# Patient Record
Sex: Female | Born: 1940 | Race: White | Hispanic: No | Marital: Married | Smoking: Never smoker
Health system: Southern US, Community
[De-identification: ages and names within clinical notes are randomized; demographics above are authoritative.]

## PROBLEM LIST (undated history)

## (undated) DIAGNOSIS — F419 Anxiety disorder, unspecified: Secondary | ICD-10-CM

## (undated) DIAGNOSIS — I4891 Unspecified atrial fibrillation: Secondary | ICD-10-CM

## (undated) DIAGNOSIS — F329 Major depressive disorder, single episode, unspecified: Secondary | ICD-10-CM

## (undated) DIAGNOSIS — B029 Zoster without complications: Secondary | ICD-10-CM

## (undated) DIAGNOSIS — K219 Gastro-esophageal reflux disease without esophagitis: Secondary | ICD-10-CM

## (undated) DIAGNOSIS — H269 Unspecified cataract: Secondary | ICD-10-CM

## (undated) DIAGNOSIS — I10 Essential (primary) hypertension: Secondary | ICD-10-CM

## (undated) DIAGNOSIS — K649 Unspecified hemorrhoids: Secondary | ICD-10-CM

## (undated) DIAGNOSIS — K5909 Other constipation: Secondary | ICD-10-CM

## (undated) DIAGNOSIS — S322XXA Fracture of coccyx, initial encounter for closed fracture: Secondary | ICD-10-CM

## (undated) DIAGNOSIS — F32A Depression, unspecified: Secondary | ICD-10-CM

## (undated) DIAGNOSIS — N952 Postmenopausal atrophic vaginitis: Secondary | ICD-10-CM

## (undated) DIAGNOSIS — M1990 Unspecified osteoarthritis, unspecified site: Secondary | ICD-10-CM

## (undated) DIAGNOSIS — M810 Age-related osteoporosis without current pathological fracture: Secondary | ICD-10-CM

## (undated) HISTORY — DX: Depression, unspecified: F32.A

## (undated) HISTORY — PX: TONSILLECTOMY: SHX5217

## (undated) HISTORY — DX: Anxiety disorder, unspecified: F41.9

## (undated) HISTORY — DX: Major depressive disorder, single episode, unspecified: F32.9

## (undated) HISTORY — DX: Unspecified cataract: H26.9

## (undated) HISTORY — DX: Gastro-esophageal reflux disease without esophagitis: K21.9

## (undated) HISTORY — DX: Other constipation: K59.09

## (undated) HISTORY — DX: Unspecified osteoarthritis, unspecified site: M19.90

## (undated) HISTORY — DX: Fracture of coccyx, initial encounter for closed fracture: S32.2XXA

## (undated) HISTORY — DX: Postmenopausal atrophic vaginitis: N95.2

## (undated) HISTORY — DX: Unspecified hemorrhoids: K64.9

## (undated) HISTORY — DX: Essential (primary) hypertension: I10

## (undated) HISTORY — DX: Zoster without complications: B02.9

---

## 1998-07-13 ENCOUNTER — Ambulatory Visit (HOSPITAL_COMMUNITY): Admission: RE | Admit: 1998-07-13 | Discharge: 1998-07-13 | Payer: Self-pay | Admitting: *Deleted

## 1998-10-20 ENCOUNTER — Other Ambulatory Visit: Admission: RE | Admit: 1998-10-20 | Discharge: 1998-10-20 | Payer: Self-pay | Admitting: *Deleted

## 1999-11-28 ENCOUNTER — Other Ambulatory Visit: Admission: RE | Admit: 1999-11-28 | Discharge: 1999-11-28 | Payer: Self-pay | Admitting: *Deleted

## 2000-11-26 ENCOUNTER — Other Ambulatory Visit: Admission: RE | Admit: 2000-11-26 | Discharge: 2000-11-26 | Payer: Self-pay | Admitting: *Deleted

## 2001-11-11 ENCOUNTER — Other Ambulatory Visit: Admission: RE | Admit: 2001-11-11 | Discharge: 2001-11-11 | Payer: Self-pay | Admitting: *Deleted

## 2002-11-12 ENCOUNTER — Other Ambulatory Visit: Admission: RE | Admit: 2002-11-12 | Discharge: 2002-11-12 | Payer: Self-pay | Admitting: *Deleted

## 2002-11-26 ENCOUNTER — Encounter: Admission: RE | Admit: 2002-11-26 | Discharge: 2002-11-26 | Payer: Self-pay | Admitting: *Deleted

## 2002-11-26 ENCOUNTER — Encounter: Payer: Self-pay | Admitting: *Deleted

## 2003-07-30 ENCOUNTER — Ambulatory Visit (HOSPITAL_COMMUNITY): Admission: RE | Admit: 2003-07-30 | Discharge: 2003-07-30 | Payer: Self-pay | Admitting: Internal Medicine

## 2003-07-30 ENCOUNTER — Encounter (INDEPENDENT_AMBULATORY_CARE_PROVIDER_SITE_OTHER): Payer: Self-pay | Admitting: Specialist

## 2003-12-03 ENCOUNTER — Other Ambulatory Visit: Admission: RE | Admit: 2003-12-03 | Discharge: 2003-12-03 | Payer: Self-pay | Admitting: Obstetrics and Gynecology

## 2004-12-15 ENCOUNTER — Encounter: Admission: RE | Admit: 2004-12-15 | Discharge: 2004-12-15 | Payer: Self-pay | Admitting: Internal Medicine

## 2005-02-20 ENCOUNTER — Ambulatory Visit: Payer: Self-pay | Admitting: Internal Medicine

## 2006-01-22 ENCOUNTER — Other Ambulatory Visit: Admission: RE | Admit: 2006-01-22 | Discharge: 2006-01-22 | Payer: Self-pay | Admitting: Obstetrics & Gynecology

## 2006-02-02 ENCOUNTER — Encounter: Admission: RE | Admit: 2006-02-02 | Discharge: 2006-02-02 | Payer: Self-pay | Admitting: Obstetrics & Gynecology

## 2006-03-15 ENCOUNTER — Encounter: Admission: RE | Admit: 2006-03-15 | Discharge: 2006-03-15 | Payer: Self-pay | Admitting: Obstetrics & Gynecology

## 2006-11-27 ENCOUNTER — Ambulatory Visit: Payer: Self-pay | Admitting: Internal Medicine

## 2006-12-03 ENCOUNTER — Ambulatory Visit (HOSPITAL_COMMUNITY): Admission: RE | Admit: 2006-12-03 | Discharge: 2006-12-03 | Payer: Self-pay | Admitting: Internal Medicine

## 2007-01-25 ENCOUNTER — Other Ambulatory Visit: Admission: RE | Admit: 2007-01-25 | Discharge: 2007-01-25 | Payer: Self-pay | Admitting: Obstetrics & Gynecology

## 2007-02-04 ENCOUNTER — Encounter: Admission: RE | Admit: 2007-02-04 | Discharge: 2007-02-04 | Payer: Self-pay | Admitting: Obstetrics & Gynecology

## 2007-09-19 DIAGNOSIS — K5909 Other constipation: Secondary | ICD-10-CM

## 2007-09-19 HISTORY — DX: Other constipation: K59.09

## 2008-01-10 ENCOUNTER — Ambulatory Visit: Payer: Self-pay | Admitting: Internal Medicine

## 2008-01-10 LAB — CONVERTED CEMR LAB
Basophils Absolute: 0 10*3/uL (ref 0.0–0.1)
Eosinophils Absolute: 0.1 10*3/uL (ref 0.0–0.7)
Eosinophils Relative: 1 % (ref 0.0–5.0)
HCT: 42.3 % (ref 36.0–46.0)
Hemoglobin: 14 g/dL (ref 12.0–15.0)
Lymphocytes Relative: 33.6 % (ref 12.0–46.0)
MCHC: 33.2 g/dL (ref 30.0–36.0)
Monocytes Absolute: 0.6 10*3/uL (ref 0.1–1.0)
Neutro Abs: 4.1 10*3/uL (ref 1.4–7.7)
Neutrophils Relative %: 57.7 % (ref 43.0–77.0)
WBC: 7.3 10*3/uL (ref 4.5–10.5)

## 2008-01-22 ENCOUNTER — Telehealth (INDEPENDENT_AMBULATORY_CARE_PROVIDER_SITE_OTHER): Payer: Self-pay | Admitting: *Deleted

## 2008-01-28 ENCOUNTER — Encounter: Payer: Self-pay | Admitting: Internal Medicine

## 2008-01-30 ENCOUNTER — Telehealth: Payer: Self-pay | Admitting: Internal Medicine

## 2008-02-05 ENCOUNTER — Ambulatory Visit: Payer: Self-pay | Admitting: Internal Medicine

## 2008-02-05 ENCOUNTER — Encounter: Payer: Self-pay | Admitting: Internal Medicine

## 2008-02-09 ENCOUNTER — Encounter: Payer: Self-pay | Admitting: Internal Medicine

## 2008-02-11 ENCOUNTER — Encounter: Admission: RE | Admit: 2008-02-11 | Discharge: 2008-02-11 | Payer: Self-pay | Admitting: Internal Medicine

## 2008-04-15 ENCOUNTER — Other Ambulatory Visit: Admission: RE | Admit: 2008-04-15 | Discharge: 2008-04-15 | Payer: Self-pay | Admitting: Obstetrics & Gynecology

## 2009-02-11 ENCOUNTER — Encounter: Admission: RE | Admit: 2009-02-11 | Discharge: 2009-02-11 | Payer: Self-pay | Admitting: Obstetrics & Gynecology

## 2009-11-02 ENCOUNTER — Emergency Department (HOSPITAL_COMMUNITY): Admission: EM | Admit: 2009-11-02 | Discharge: 2009-11-02 | Payer: Self-pay | Admitting: Emergency Medicine

## 2009-11-18 ENCOUNTER — Encounter: Payer: Self-pay | Admitting: Internal Medicine

## 2009-12-17 ENCOUNTER — Ambulatory Visit: Payer: Self-pay | Admitting: Internal Medicine

## 2009-12-17 DIAGNOSIS — R002 Palpitations: Secondary | ICD-10-CM

## 2009-12-17 DIAGNOSIS — I10 Essential (primary) hypertension: Secondary | ICD-10-CM

## 2009-12-17 DIAGNOSIS — R55 Syncope and collapse: Secondary | ICD-10-CM

## 2010-01-12 ENCOUNTER — Encounter: Payer: Self-pay | Admitting: Internal Medicine

## 2010-02-25 ENCOUNTER — Encounter: Admission: RE | Admit: 2010-02-25 | Discharge: 2010-02-25 | Payer: Self-pay | Admitting: Internal Medicine

## 2010-05-24 ENCOUNTER — Ambulatory Visit (HOSPITAL_COMMUNITY): Admission: RE | Admit: 2010-05-24 | Discharge: 2010-05-24 | Payer: Self-pay | Admitting: Internal Medicine

## 2010-06-03 ENCOUNTER — Ambulatory Visit: Payer: Self-pay | Admitting: Cardiovascular Disease

## 2010-06-03 ENCOUNTER — Inpatient Hospital Stay (HOSPITAL_COMMUNITY): Admission: EM | Admit: 2010-06-03 | Discharge: 2010-06-04 | Payer: Self-pay | Admitting: Emergency Medicine

## 2010-06-04 ENCOUNTER — Encounter: Payer: Self-pay | Admitting: Cardiovascular Disease

## 2010-06-08 ENCOUNTER — Telehealth (INDEPENDENT_AMBULATORY_CARE_PROVIDER_SITE_OTHER): Payer: Self-pay | Admitting: *Deleted

## 2010-06-09 ENCOUNTER — Ambulatory Visit: Payer: Self-pay | Admitting: Internal Medicine

## 2010-06-09 ENCOUNTER — Ambulatory Visit: Payer: Self-pay

## 2010-06-09 ENCOUNTER — Encounter: Payer: Self-pay | Admitting: Internal Medicine

## 2010-06-09 ENCOUNTER — Encounter (HOSPITAL_COMMUNITY): Admission: RE | Admit: 2010-06-09 | Discharge: 2010-08-19 | Payer: Self-pay | Admitting: Internal Medicine

## 2010-06-20 ENCOUNTER — Telehealth: Payer: Self-pay | Admitting: Internal Medicine

## 2010-06-21 ENCOUNTER — Ambulatory Visit: Payer: Self-pay | Admitting: Internal Medicine

## 2010-06-21 DIAGNOSIS — I4891 Unspecified atrial fibrillation: Secondary | ICD-10-CM

## 2010-06-21 DIAGNOSIS — R609 Edema, unspecified: Secondary | ICD-10-CM | POA: Insufficient documentation

## 2010-07-12 ENCOUNTER — Telehealth (INDEPENDENT_AMBULATORY_CARE_PROVIDER_SITE_OTHER): Payer: Self-pay | Admitting: *Deleted

## 2010-09-27 ENCOUNTER — Ambulatory Visit: Admit: 2010-09-27 | Payer: Self-pay | Admitting: Internal Medicine

## 2010-10-18 NOTE — Assessment & Plan Note (Signed)
Summary: eph./ appt is 1:30/ gd   Visit Type:  Follow-up Primary Provider:  Guerry Bruin   History of Present Illness: April Decker returns today for evaluation of peripheral edema after starting cardizem.  The patient has a h/o unexplained syncope and has undergone evaluation with a cardiac monitor which has been uneventful.  She was seen in the hospital with new onset atrial fib and had a stress test which was negative.  She has preserved LV function.  She has not had syncope since I last saw her in the hospital.  She denies dietary non-compliance though she admits to craving salt at times.  No other complaints today.    Current Medications (verified): 1)  Aspirin 81 Mg Tbec (Aspirin) .... Take One Tablet By Mouth Daily 2)  Diltiazem Hcl Er Beads 180 Mg Xr24h-Cap (Diltiazem Hcl Er Beads) .... Take One Capsule By Mouth Daily 3)  Protonix 40 Mg Solr (Pantoprazole Sodium) .... Once Daily 4)  Reclast 5 Mg/155ml Soln (Zoledronic Acid) .... Uad 5)  Nitrostat 0.4 Mg Subl (Nitroglycerin) .Marland Kitchen.. 1 Tablet Under Tongue At Onset of Chest Pain; You May Repeat Every 5 Minutes For Up To 3 Doses. 6)  Multivitamins   Tabs (Multiple Vitamin) .... Once Daily  Allergies: 1)  ! * X-Ray Dye 2)  ! * Shellfish  Past History:  Past Medical History: Last updated: 12/17/2009 Current Problems:  SYNCOPE (ICD-780.2)  GERD HTN  Review of Systems       All systems reviewed and negative except as noted in the HPI and fatigue.  Vital Signs:  Patient profile:   70 year old female Height:      66.5 inches Weight:      134 pounds BMI:     21.38 Pulse rate:   72 / minute BP sitting:   150 / 80  (left arm)  Vitals Entered By: Laurance Flatten CMA (June 21, 2010 3:05 PM)  Physical Exam  General:  Well developed, well nourished, in no acute distress.  HEENT: normal Neck: supple. No JVD. Carotids 2+ bilaterally no bruits Cor: RRR no rubs, gallops or murmur Lungs: CTA with no wheezes, rales, or rhonchi. Ab:  soft, nontender. nondistended. No HSM. Good bowel sounds Ext: warm. no cyanosis, clubbing or edema Neuro: alert and oriented. Grossly nonfocal. affect pleasant    Event Monitor  Procedure date:  06/21/2010  Findings:      NSR  Impression & Recommendations:  Problem # 1:  ATRIAL FIBRILLATION (ICD-427.31) Her symptoms appear to be controlled.  I will continue a low dose of beta blocker. Her updated medication list for this problem includes:    Aspirin 81 Mg Tbec (Aspirin) .Marland Kitchen... Take one tablet by mouth daily    Carvedilol 3.125 Mg Tabs (Carvedilol) ..... One by mouth two times a day  Problem # 2:  ESSENTIAL HYPERTENSION, BENIGN (ICD-401.1) Her blood pressure is elevated today.  Will start a low dose of carvedilol. Her updated medication list for this problem includes:    Aspirin 81 Mg Tbec (Aspirin) .Marland Kitchen... Take one tablet by mouth daily    Carvedilol 3.125 Mg Tabs (Carvedilol) ..... One by mouth two times a day  Problem # 3:  SYNCOPE (ICD-780.2) She has had no recent episodes. Will follow. Her updated medication list for this problem includes:    Aspirin 81 Mg Tbec (Aspirin) .Marland Kitchen... Take one tablet by mouth daily    Nitrostat 0.4 Mg Subl (Nitroglycerin) .Marland Kitchen... 1 tablet under tongue at onset of chest pain;  you may repeat every 5 minutes for up to 3 doses.    Carvedilol 3.125 Mg Tabs (Carvedilol) ..... One by mouth two times a day  Problem # 4:  EDEMA (ICD-782.3) this is a new problem and is likely related to her cardizem. She has stopped this and will maintain a low sodium diet.  Patient Instructions: 1)  Your physician recommends that you schedule a follow-up appointment in: 3 months with Dr Ladona Ridgel 2)  Your physician has recommended you make the following change in your medication: stop Diltiazem and start Carvedilol 3.125mg  two times a day Prescriptions: CARVEDILOL 3.125 MG TABS (CARVEDILOL) one by mouth two times a day  #60 x 6   Entered by:   Dennis Bast, RN, BSN   Authorized  by:   Laren Boom, MD, [facility]   Signed by:   Dennis Bast, RN, BSN on 06/21/2010   Method used:   Electronically to        [facility]* (retail)       [address]       [place], [place]  [address]       Ph: [phone]       Fax: [phone]   RxID:   [id number]

## 2010-10-18 NOTE — Letter (Signed)
Summary: [facility] Phone Note  [facility] Phone Note   Imported By: Roderic Ovens 12/27/2009 14:08:15  _____________________________________________________________________  External Attachment:    Type:   Image     Comment:   External Document

## 2010-10-18 NOTE — Assessment & Plan Note (Signed)
Summary: Cardiology Nuclear Testing  Nuclear Med Background Indications for Stress Test: Evaluation for Ischemia, Post Hospital  Indications Comments: 06/03/10 [facility]: CP/tightness/fatigue/DOE > PAF/Flutter  History: Echo, Myocardial Perfusion Study  History Comments: 06/04/10 ECHO EF 60% MPS H/O Syncope  Symptoms: Chest Pain, Chest Tightness, DOE, Palpitations    Nuclear Pre-Procedure Cardiac Risk Factors: Hypertension Caffeine/Decaff Intake: Green tea at 7:00 AM NPO After: 7:00 AM Lungs: clear IV 0.9% NS with Angio Cath: 20g     IV Site: R Hand IV Started by: Doyne Keel, CNMT Chest Size (in) 38     Cup Size B     Height (in): 66.5 Weight (lb): 135 BMI: 21.54  Nuclear Med Study 1 or 2 day study:  1 day     Stress Test Type:  Stress Reading MD:  Arvilla Meres, MD     Referring MD:  G.Taylor Resting Radionuclide:  Technetium 77m Tetrofosmin     Resting Radionuclide Dose:  11 mCi  Stress Radionuclide:  Technetium 56m Tetrofosmin     Stress Radionuclide Dose:  32.8 mCi   Stress Protocol Exercise Time (min):  7:00 min     Max HR:  150 bpm     Predicted Max HR:  149 bpm  Max Systolic BP: 215 mm Hg     Percent Max HR:  100.67 %     METS: 8.5 Rate Pressure Product:  09811    Stress Test Technologist:  Milana Na, EMT-P     Nuclear Technologist:  Doyne Keel, CNMT  Rest Procedure  Myocardial perfusion imaging was performed at rest 45 minutes following the intravenous administration of Technetium 65m Tetrofosmin.  Stress Procedure  The patient exercised for 7:00. The patient stopped due to fatigue and denied any chest pain.  There were non specific ST-T wave change, rare pacs, and pat.  Technetium 67m Tetrofosmin was injected at peak exercise and myocardial perfusion imaging was performed after a brief delay.  QPS Raw Data Images:  Normal; no motion artifact; normal heart/lung ratio. Transient Ischemic Dilatation:  1.01  (Normal <1.22)  Lung/Heart Ratio:  0.30   (Normal <0.45)  Quantitative Gated Spect Images QGS EDV:  57 ml QGS ESV:  7 ml QGS EF:  87 % QGS cine images:  Normal  Findings Normal nuclear study      Overall Impression  Exercise Capacity: Fair exercise capacity. BP Response: Hypertensive blood pressure response. Clinical Symptoms: No chest pain ECG Impression: Insignificant upsloping ST segment depression. Overall Impression: Normal stress nuclear study.

## 2010-10-18 NOTE — Procedures (Signed)
Summary: Summary Report  Summary Report   Imported By: Erle Crocker 06/23/2010 13:12:01  _____________________________________________________________________  External Attachment:    Type:   Image     Comment:   External Document

## 2010-10-18 NOTE — Assessment & Plan Note (Signed)
Summary: nep/syncope/jss   Visit Type:  nep/syncope Primary Provider:  Guerry Bruin   History of Present Illness: April Decker is referred today by Dr. Wylene Simmer for evaluation of unexplained syncope.  The patient has a h/o HTN.  She had her initial episode on Dec. 28.  She had not eaten breakfast when she was cleaning her house.  She began to feelight headed as if she would fall.  She noted that the room turned dark and she held on to the wall before sitting down.  She eventually improved.  She went to the ED and was monitored. There her initial blood pressure was high.  She had another episode just over a month ago when she was late for a doctor's visit and when she finally got to the office, she felt like she was about to pass out.  She was placed on a monitor and was taken to the ED.  She was again monitored for several hours before being discharged. No additional spells since.  She notes concommitant palpitations which she describes as a butterfly in her chest.  The last seconds to minutes and are not associated with syncope.  Problems Prior to Update: 1)  Syncope  (ICD-780.2)  Current Medications (verified): 1)  Aspirin 81 Mg Tbec (Aspirin) .... Take One Tablet By Mouth Daily  Allergies (verified): 1)  ! * X-Ray Dye 2)  ! * Shellfish  Past History:  Past Medical History: Current Problems:  SYNCOPE (ICD-780.2)  GERD HTN  Family History: Mother died of CA at 65 Father died of a stroke at 107  Social History: Married denies ETOH or tobacco abuse.  Review of Systems       All systems reviewed and negative except as noted in the HPI.  Vital Signs:  Patient profile:   70 year old female Height:      66 inches Weight:      131 pounds BMI:     21.22 Pulse rate:   77 / minute BP sitting:   121 / 65  (left arm)  Vitals Entered By: Oswald Hillock (December 17, 2009 10:14 AM)  Serial Vital Signs/Assessments:  Time      Position  BP       Pulse  Resp  Temp     By               132/76   80                    Oswald Hillock                     121/65   [address]                     121/79                         Oswald Hillock                     846/96   [address]                     126/77   80  Oswald Hillock  Comments: no symptoms By: Oswald Hillock  2 mins  no symptoms By: Oswald Hillock   3 min. no symptoms By: Oswald Hillock    Physical Exam  General:  Well developed, well nourished, in no acute distress.  HEENT: normal Neck: supple. No JVD. Carotids 2+ bilaterally no bruits Cor: RRR no rubs, gallops or murmur Lungs: CTA with no wheezes, rales, or rhonchi. Ab: soft, nontender. nondistended. No HSM. Good bowel sounds Ext: warm. no cyanosis, clubbing or edema Neuro: alert and oriented. Grossly nonfocal. affect pleasant    EKG  Procedure date:  12/17/2009  Findings:      Normal sinus rhythm with rate of:  76. IRBBB.  Impression & Recommendations:  Problem # 1:  SYNCOPE (ICD-780.2) She has actually never lost postural tone and fallen out.  Her symptoms are most consistent with neurally mediated syncope with evidence of orthostasis.  I have asked that she stop her ASA as she had a h/o falling Hgb and to not miss any meals, increase her fluid and salt intake, minimize caffiene and ETOH, and most importantly to go to a horizontal position when she has a spell. Her updated medication list for this problem includes:    Aspirin 81 Mg Tbec (Aspirin) .Marland Kitchen... Take one tablet by mouth daily  Problem # 2:  ESSENTIAL HYPERTENSION, BENIGN (ICD-401.1) Her blood pressure has been minimally elevated and she did not drop significantly with orthostatic testing today.  I have asked that she increase her salt and fluid intake as I have noted that patients with syncope will often have fewer episodes if they live with a little higher  pressure. Her updated medication list for this problem includes:    Aspirin 81 Mg Tbec (Aspirin) .Marland Kitchen... Take one tablet by mouth daily  Problem # 3:  PALPITATIONS, OCCASIONAL (ICD-785.1) This appears to be a minimal problem.  I have requested a period of watchful waiting. Her updated medication list for this problem includes:    Aspirin 81 Mg Tbec (Aspirin) .Marland Kitchen... Take one tablet by mouth daily  Patient Instructions: 1)  Your physician wants you to follow-up in: 6 months with Dr Ladona Ridgel.  You will receive a reminder letter in the mail two months in advance. If you don't receive a letter, please call our office to schedule the follow-up appointment.

## 2010-10-18 NOTE — Progress Notes (Signed)
Summary: c/o after stress test -trouble walking  Phone Note Call from Patient Call back at Home Phone [phone]   Caller: Patient Reason for Call: Talk to Nurse, Lab or Test Results Details for Reason: c/o after stress test having trouble walking. pain in left foot.  Initial call taken by: Lorne Skeens,  June 20, 2010 9:06 AM  Follow-up for Phone Call        9/22 rt leg started hurting (woke her up ) during night.  was able to fall off to sleep.  Since then has had sweeling in ankles and soreness on outsides of feet aand ankels down to bottom of feet.  Has had trouble walking . discussed with Dr Ladona Ridgel.  Prob d/c Cardizem.  He will discuss at OV today Dennis Bast, RN, BSN  June 21, 2010 3:41 PM

## 2010-10-18 NOTE — Progress Notes (Signed)
  Records recieved from [facility] gave to [facility] for [facility] 09/27/10 appt. [facility] [place]  July 12, 2010 8:42 AM

## 2010-10-18 NOTE — Progress Notes (Signed)
Summary: Nuclear Pre-Procedure  Phone Note Outgoing Call Call back at [facility] Phone [phone]   Call placed by: Stanton Kidney, EMT-P,  June 08, 2010 2:42 PM Call placed to: Patient Action Taken: Phone Call Completed Summary of Call: Reviewed information on [employer] Information Sheet (see scanned document for further details).  Spoke with the Patient.    Nuclear Med Background Indications for Stress Test: Evaluation for Ischemia, Post Hospital  Indications Comments: 06/03/10 [facility]: CP/tightness/fatigue/DOE > PAF/Flutter  History: Echo   Symptoms: Chest Pain, Chest Tightness, DOE, Palpitations    Nuclear Pre-Procedure Cardiac Risk Factors: Hypertension Height (in): 66

## 2010-10-21 NOTE — Letter (Signed)
Summary: [facility] Office Note  [facility] Office Note   Imported By: Roderic Ovens 12/27/2009 14:09:03  _____________________________________________________________________  External Attachment:    Type:   Image     Comment:   External Document

## 2010-12-01 LAB — CBC
Hemoglobin: 13.8 g/dL (ref 12.0–15.0)
MCHC: 34.3 g/dL (ref 30.0–36.0)
Platelets: 234 10*3/uL (ref 150–400)
Platelets: 240 10*3/uL (ref 150–400)
RDW: 12.9 % (ref 11.5–15.5)
RDW: 13.2 % (ref 11.5–15.5)
WBC: 7.2 10*3/uL (ref 4.0–10.5)
WBC: 8.6 10*3/uL (ref 4.0–10.5)

## 2010-12-01 LAB — CARDIAC PANEL(CRET KIN+CKTOT+MB+TROPI)
CK, MB: 1.2 ng/mL (ref 0.3–4.0)
CK, MB: 1.4 ng/mL (ref 0.3–4.0)
CK, MB: 1.5 ng/mL (ref 0.3–4.0)
Relative Index: INVALID (ref 0.0–2.5)
Troponin I: 0.02 ng/mL (ref 0.00–0.06)

## 2010-12-01 LAB — URINE CULTURE
Colony Count: NO GROWTH
Culture  Setup Time: 201109161113
Culture: NO GROWTH

## 2010-12-01 LAB — BASIC METABOLIC PANEL
BUN: 10 mg/dL (ref 6–23)
BUN: 12 mg/dL (ref 6–23)
CO2: 28 mEq/L (ref 19–32)
Chloride: 103 mEq/L (ref 96–112)
Chloride: 105 mEq/L (ref 96–112)
Creatinine, Ser: 0.77 mg/dL (ref 0.4–1.2)
GFR calc Af Amer: >60 mL/min (ref >60)
GFR calc Af Amer: >60 mL/min (ref >60)
Potassium: 4.5 mEq/L (ref 3.5–5.1)
Sodium: 139 mEq/L (ref 135–145)

## 2010-12-01 LAB — POCT CARDIAC MARKERS
CKMB, poc: 1 ng/mL (ref 1.0–8.0)
Myoglobin, poc: 49.4 ng/mL (ref 12–200)

## 2010-12-01 LAB — DIFFERENTIAL
Basophils Absolute: 0 10*3/uL (ref 0.0–0.1)
Basophils Relative: 0 % (ref 0–1)
Eosinophils Relative: 1 % (ref 0–5)
Lymphs Abs: 1.6 10*3/uL (ref 0.7–4.0)
Neutro Abs: 6.2 10*3/uL (ref 1.7–7.7)

## 2010-12-01 LAB — URINALYSIS, ROUTINE W REFLEX MICROSCOPIC
Glucose, UA: NEGATIVE mg/dL
Hgb urine dipstick: NEGATIVE
Nitrite: NEGATIVE
Protein, ur: NEGATIVE mg/dL
Urobilinogen, UA: 0.2 mg/dL (ref 0.0–1.0)
pH: 7.5 (ref 5.0–8.0)

## 2010-12-01 LAB — LIPID PANEL
Cholesterol: 156 mg/dL (ref 0–200)
LDL Cholesterol: 75 mg/dL (ref 0–99)
Triglycerides: 50 mg/dL (ref <150)

## 2010-12-01 LAB — PROTIME-INR: Prothrombin Time: 13.6 seconds (ref 11.6–15.2)

## 2010-12-01 LAB — APTT: aPTT: 32 seconds (ref 24–37)

## 2010-12-01 LAB — CK TOTAL AND CKMB (NOT AT ARMC): Total CK: 62 U/L (ref 7–177)

## 2010-12-01 LAB — TROPONIN I: Troponin I: 0.01 ng/mL (ref 0.00–0.06)

## 2010-12-01 LAB — TSH: TSH: 3.028 u[IU]/mL (ref 0.350–4.500)

## 2010-12-08 LAB — DIFFERENTIAL
Eosinophils Relative: 1 % (ref 0–5)
Lymphocytes Relative: 20 % (ref 12–46)
Lymphs Abs: 1.5 10*3/uL (ref 0.7–4.0)
Monocytes Absolute: 0.6 10*3/uL (ref 0.1–1.0)

## 2010-12-08 LAB — URINALYSIS, ROUTINE W REFLEX MICROSCOPIC
Ketones, ur: NEGATIVE mg/dL
Nitrite: NEGATIVE
Protein, ur: NEGATIVE mg/dL
Urobilinogen, UA: 0.2 mg/dL (ref 0.0–1.0)
pH: 6.5 (ref 5.0–8.0)

## 2010-12-08 LAB — GLUCOSE, CAPILLARY: Glucose-Capillary: 86 mg/dL (ref 70–99)

## 2010-12-08 LAB — POCT I-STAT, CHEM 8
BUN: 17 mg/dL (ref 6–23)
Chloride: 106 mEq/L (ref 96–112)
Creatinine, Ser: 0.7 mg/dL (ref 0.4–1.2)
Sodium: 139 mEq/L (ref 135–145)
TCO2: 27 mmol/L (ref 0–100)

## 2010-12-08 LAB — POCT CARDIAC MARKERS
CKMB, poc: 1.5 ng/mL (ref 1.0–8.0)
Myoglobin, poc: 52.2 ng/mL (ref 12–200)
Troponin i, poc: <0.05 ng/mL (ref 0.00–0.09)

## 2010-12-08 LAB — CBC
HCT: 37.1 % (ref 36.0–46.0)
Hemoglobin: 12.9 g/dL (ref 12.0–15.0)
Platelets: 217 10*3/uL (ref 150–400)
WBC: 7.8 10*3/uL (ref 4.0–10.5)

## 2011-01-31 NOTE — Assessment & Plan Note (Signed)
[facility]                         GASTROENTEROLOGY OFFICE NOTE   JAKE, GOODSON                         MRN:          [id number]  DATE:01/10/2008                            DOB:          [birth year]    REFERRING PHYSICIAN:  Gaspar Garbe, M.D.   Office Add-On Note   PROBLEM:  Anemia.   HISTORY:  April Decker is a pleasant 70 year old female with history of chronic  constipation.  She is known to Dr. Juanda Chance and was last seen about a year  ago in March of 2008 at that time with her chronic colonic dysmotility.  She last underwent colonoscopy in November of 2004 and this was a normal  exam.  She had endoscopy as well in November of 2004, which did show one  small gastric polyp, otherwise negative exam.   The patient does not have a history of anemia as far as she is aware,  had recent lab work done and was found to have a hemoglobin of 9.9.  We  have copies of those labs.  Her hematocrit was 27.7, MCV of 88,  platelets 176, WBC of 4.2, BUN and creatinine were normal, and liver  function studies also normal.  These were done on 12/31/2007.  She did  have a vitamin D level done as well, which was 44, within normal limits.  The patient in retrospect, said she feels that she did have a very dark  stool several weeks ago, but had also eaten a lot of blackberries around  that time, and really did not think anything of it at the time.  She has  not noted any other melena or hematochezia.  She does suffer from  chronic GERD and said that she has had a lot of heartburn and  indigestion over the past couple of month.  She is on Prilosec 20 mg at  h.s.  She has occasional complaints of dysphasia particularly with  pills, and feels that she gets some irritation with swallowing  especially with very dry foods.  She had been on Fosamax, which she is  now holding per Dr. Wylene Simmer.  She denies any regular aspirin or NSAID  use.  She has had a lot of intestinal  gurgling and noises, but has not  had any abdominal pain, changes in her bowel habits, etc.   CURRENT MEDICATIONS:  1. Fosamax Plus D weekly, currently on hold.  2. Prilosec 20 mg daily.  3. Glucosamine/Chondroitin t.i.d.  4. Ester-C supplement daily.  5. Omega 3 daily.  6. Multivitamin daily.   ALLERGIES/INTOLERANCES:  IVP DYE AND SHELLFISH.   FAMILY HISTORY:  Mother with some sort of cancer complicated by liver  METS.  No other family history of colon cancer of GI malignancies that  she is aware of.   EXAMINATION:  Well developed, white female in no acute distress.  Weight  is 131.8, blood pressure 124/76, pulse 78.  HEENT:  Nontraumatic, normocephalic.  EOMI,  PERRLA.  Sclerae anicteric.  CARDIOVASCULAR:  Regular rate and rhythm with S1, S2.  No murmur, rub or  gallop.  PULMONARY:  Clear to auscultation and percussion.  ABDOMEN:  Soft.  Bowel sounds are active.  She is nontender.  There is  no palpable mass.  She does have some fullness in mid abdomen.  No  hepatosplenomegaly  RECTAL EXAM:  Stool is Hemoccult negative.   IMPRESSION:  1. 70 year old female with new normocytic anemia of unclear      etiology.  Rule out intermittent occult GI blood loss.  Rule out      iron deficiency.  2. Gastroesophageal reflux disease, poorly controlled.  Rule out      esophagitis.  3. Dysphagia, intermittent.  Rule out peptic stricture.   PLAN:  1. Schedule upper endoscopy with possible esophageal dilation.  2. Schedule colonoscopy.  3. Check iron studies.  4. Followup hemoglobin/hematocrit.  5. Continue to hold the Fosamax.  6. Switch from generic Prilosec to Nexium 40 mg b.i.d.      Mike Gip, PA-C  Electronically Signed      Iva Boop, MD,FACG  Electronically Signed   AE/MedQ  DD: 01/13/2008  DT: 01/13/2008  Job #: [id number]   cc:   Gaspar Garbe, M.D.  Hedwig Morton. Juanda Chance, MD

## 2011-02-03 NOTE — Assessment & Plan Note (Signed)
HEALTHCARE                         GASTROENTEROLOGY OFFICE NOTE   NAME:Garbers, SHANITRA PHILLIPPI                         MRN:          [id number]  DATE:11/27/2006                            DOB:          [birth year]    Ms. Beitz is a very nice 70 year old white female with history of  chronic constipation and intermittent lower abdominal pain. We have  evaluated her in the past. She presented for a screening colonoscopy in  November 2004 and she at that time was quite constipated. Upper  endoscopy and colonoscopy on July 30, 2003 showed essentially normal  colon as well as normal stomach and duodenum. She has had intermittent  left lower quadrant abdominal pain more so recently. The patient has  seen Dr. Leda Quail for GYN followup  in the summer of 2007 and was  found to have a normal exam . CT scan of the abdomen and pelvis at  [facility] on Jan 22, 2006 was done without IV CONTRAST because  of patient's allergy. The exam was essentially normal except for large  amount of retained stool in the colon. There was no evidence of  structural lesion.  Dr Hyacinth Meeker referred her to Dr Loreta Ave, but pt  did not  undergo any furhte GI evaluation.The patient since then has had  intermittent left lower quadrant abdominal pain, localized anteriorly  which does not radiate to the back. There has been no rectal bleeding.   MEDICATIONS:  1. Fosamax 70 mg weekly.  2. Multiple vitamins.  3. Omega-3.  4. Aspirin 81 mg p.o. daily.  5. Vitamin D.   PHYSICAL EXAMINATION:  VITAL SIGNS:  Blood pressure 150/74, pulse 60,  weight 135 pounds.  GENERAL:  She was alert, oriented, in no distress.  LUNGS:  Clear to auscultation.  COR:  With normal S1, normal S2.  ABDOMEN:  Soft with minimal discomfort in left lower quadrant and left  middle quadrant without palpable or focal on the right middle and upper  quadrants which was palpable and most likely represented a full colon  of  stool, but cannot rule out mass. Stool was Hemoccult negative.   The patient was also evaluated by Dr. Loreta Ave. She was referred to Dr. Loreta Ave  by Dr. Hyacinth Meeker. In the office around the time of the CT scan of the  abdomen, the patient was not satisfied with the evaluation and returned  back to my care.   IMPRESSION:  A 70 year old white female with chronic colonic dysmotility  causing constipation clinically as well as radiographically. She ,in the  past ,was on mild laxatives without effective  response. She is in need  of somewhat stronger regimen. There is no evidence of occult GI blood  loss. There were no structural lesions on last colonoscopy four years  ago.   PLAN:  1. Colonic Sits mark study to assess the colonic motility.  2. Start MiraLax 17 g every day to every other day after completion of      the Sitz marks.  3. She has complete physical exam scheduled with Dr. Wylene Simmer mid April  2008.  4. Depending on the result of the Sitz marks and of the response to      MiraLax, we may need to repeat a colonoscopic exam .     Hedwig Morton. Juanda Chance, MD  Electronically Signed    DMB/MedQ  DD: 11/27/2006  DT: 11/29/2006  Job #: [id number]   cc:   Gaspar Garbe, M.D.  Lum Keas, MD

## 2011-02-14 ENCOUNTER — Other Ambulatory Visit: Payer: Self-pay | Admitting: Internal Medicine

## 2011-02-14 DIAGNOSIS — N644 Mastodynia: Secondary | ICD-10-CM

## 2011-02-14 DIAGNOSIS — N6320 Unspecified lump in the left breast, unspecified quadrant: Secondary | ICD-10-CM

## 2011-02-28 ENCOUNTER — Ambulatory Visit
Admission: RE | Admit: 2011-02-28 | Discharge: 2011-02-28 | Disposition: A | Discharge status: Home / Self Care | Payer: Medicare Other | Source: Ambulatory Visit | Attending: Internal Medicine | Admitting: Internal Medicine

## 2011-02-28 DIAGNOSIS — N6320 Unspecified lump in the left breast, unspecified quadrant: Secondary | ICD-10-CM

## 2011-02-28 DIAGNOSIS — N644 Mastodynia: Secondary | ICD-10-CM

## 2011-10-23 DIAGNOSIS — R002 Palpitations: Secondary | ICD-10-CM | POA: Diagnosis not present

## 2011-10-23 DIAGNOSIS — I4891 Unspecified atrial fibrillation: Secondary | ICD-10-CM | POA: Diagnosis not present

## 2011-10-23 DIAGNOSIS — F411 Generalized anxiety disorder: Secondary | ICD-10-CM | POA: Diagnosis not present

## 2011-10-23 DIAGNOSIS — I10 Essential (primary) hypertension: Secondary | ICD-10-CM | POA: Diagnosis not present

## 2011-10-23 DIAGNOSIS — F329 Major depressive disorder, single episode, unspecified: Secondary | ICD-10-CM | POA: Diagnosis not present

## 2012-01-09 ENCOUNTER — Other Ambulatory Visit: Payer: Self-pay | Admitting: Dermatology

## 2012-01-09 DIAGNOSIS — L259 Unspecified contact dermatitis, unspecified cause: Secondary | ICD-10-CM | POA: Diagnosis not present

## 2012-01-16 DIAGNOSIS — R350 Frequency of micturition: Secondary | ICD-10-CM | POA: Diagnosis not present

## 2012-01-16 DIAGNOSIS — R82998 Other abnormal findings in urine: Secondary | ICD-10-CM | POA: Diagnosis not present

## 2012-01-16 DIAGNOSIS — R21 Rash and other nonspecific skin eruption: Secondary | ICD-10-CM | POA: Diagnosis not present

## 2012-01-16 DIAGNOSIS — F411 Generalized anxiety disorder: Secondary | ICD-10-CM | POA: Diagnosis not present

## 2012-01-24 DIAGNOSIS — Z79899 Other long term (current) drug therapy: Secondary | ICD-10-CM | POA: Diagnosis not present

## 2012-01-24 DIAGNOSIS — M949 Disorder of cartilage, unspecified: Secondary | ICD-10-CM | POA: Diagnosis not present

## 2012-01-24 DIAGNOSIS — M899 Disorder of bone, unspecified: Secondary | ICD-10-CM | POA: Diagnosis not present

## 2012-01-30 DIAGNOSIS — N390 Urinary tract infection, site not specified: Secondary | ICD-10-CM | POA: Diagnosis not present

## 2012-01-30 DIAGNOSIS — R350 Frequency of micturition: Secondary | ICD-10-CM | POA: Diagnosis not present

## 2012-02-01 DIAGNOSIS — Z79899 Other long term (current) drug therapy: Secondary | ICD-10-CM | POA: Diagnosis not present

## 2012-02-01 DIAGNOSIS — F411 Generalized anxiety disorder: Secondary | ICD-10-CM | POA: Diagnosis not present

## 2012-02-01 DIAGNOSIS — M899 Disorder of bone, unspecified: Secondary | ICD-10-CM | POA: Diagnosis not present

## 2012-02-01 DIAGNOSIS — Z0000 Encounter for general adult medical examination without abnormal findings: Secondary | ICD-10-CM | POA: Diagnosis not present

## 2012-02-01 DIAGNOSIS — M949 Disorder of cartilage, unspecified: Secondary | ICD-10-CM | POA: Diagnosis not present

## 2012-02-01 DIAGNOSIS — R21 Rash and other nonspecific skin eruption: Secondary | ICD-10-CM | POA: Diagnosis not present

## 2012-02-01 DIAGNOSIS — Z1212 Encounter for screening for malignant neoplasm of rectum: Secondary | ICD-10-CM | POA: Diagnosis not present

## 2012-02-13 DIAGNOSIS — H2510 Age-related nuclear cataract, unspecified eye: Secondary | ICD-10-CM | POA: Diagnosis not present

## 2012-02-13 DIAGNOSIS — H04129 Dry eye syndrome of unspecified lacrimal gland: Secondary | ICD-10-CM | POA: Diagnosis not present

## 2012-02-13 DIAGNOSIS — H43819 Vitreous degeneration, unspecified eye: Secondary | ICD-10-CM | POA: Diagnosis not present

## 2012-02-28 DIAGNOSIS — R350 Frequency of micturition: Secondary | ICD-10-CM | POA: Diagnosis not present

## 2012-03-11 DIAGNOSIS — N390 Urinary tract infection, site not specified: Secondary | ICD-10-CM | POA: Diagnosis not present

## 2012-03-25 ENCOUNTER — Other Ambulatory Visit: Payer: Self-pay | Admitting: Internal Medicine

## 2012-03-25 DIAGNOSIS — Z1231 Encounter for screening mammogram for malignant neoplasm of breast: Secondary | ICD-10-CM

## 2012-04-08 ENCOUNTER — Ambulatory Visit
Admission: RE | Admit: 2012-04-08 | Discharge: 2012-04-08 | Disposition: A | Discharge status: Home / Self Care | Payer: Medicare Other | Source: Ambulatory Visit | Attending: Internal Medicine | Admitting: Internal Medicine

## 2012-04-08 DIAGNOSIS — Z1231 Encounter for screening mammogram for malignant neoplasm of breast: Secondary | ICD-10-CM | POA: Diagnosis not present

## 2012-04-17 DIAGNOSIS — M899 Disorder of bone, unspecified: Secondary | ICD-10-CM | POA: Diagnosis not present

## 2012-04-25 DIAGNOSIS — I10 Essential (primary) hypertension: Secondary | ICD-10-CM | POA: Diagnosis not present

## 2012-04-25 DIAGNOSIS — N3000 Acute cystitis without hematuria: Secondary | ICD-10-CM | POA: Diagnosis not present

## 2012-05-23 DIAGNOSIS — M546 Pain in thoracic spine: Secondary | ICD-10-CM | POA: Diagnosis not present

## 2012-05-27 DIAGNOSIS — M546 Pain in thoracic spine: Secondary | ICD-10-CM | POA: Diagnosis not present

## 2012-05-30 DIAGNOSIS — M546 Pain in thoracic spine: Secondary | ICD-10-CM | POA: Diagnosis not present

## 2012-05-31 DIAGNOSIS — M546 Pain in thoracic spine: Secondary | ICD-10-CM | POA: Diagnosis not present

## 2012-05-31 DIAGNOSIS — M899 Disorder of bone, unspecified: Secondary | ICD-10-CM | POA: Diagnosis not present

## 2012-05-31 DIAGNOSIS — M1990 Unspecified osteoarthritis, unspecified site: Secondary | ICD-10-CM | POA: Diagnosis not present

## 2012-05-31 DIAGNOSIS — M542 Cervicalgia: Secondary | ICD-10-CM | POA: Diagnosis not present

## 2012-06-12 DIAGNOSIS — M546 Pain in thoracic spine: Secondary | ICD-10-CM | POA: Diagnosis not present

## 2012-06-19 DIAGNOSIS — Z124 Encounter for screening for malignant neoplasm of cervix: Secondary | ICD-10-CM | POA: Diagnosis not present

## 2012-06-19 DIAGNOSIS — Z01419 Encounter for gynecological examination (general) (routine) without abnormal findings: Secondary | ICD-10-CM | POA: Diagnosis not present

## 2012-06-19 DIAGNOSIS — Z23 Encounter for immunization: Secondary | ICD-10-CM | POA: Diagnosis not present

## 2012-06-19 DIAGNOSIS — M546 Pain in thoracic spine: Secondary | ICD-10-CM | POA: Diagnosis not present

## 2012-06-25 DIAGNOSIS — M546 Pain in thoracic spine: Secondary | ICD-10-CM | POA: Diagnosis not present

## 2012-06-28 DIAGNOSIS — R51 Headache: Secondary | ICD-10-CM | POA: Diagnosis not present

## 2012-06-28 DIAGNOSIS — M899 Disorder of bone, unspecified: Secondary | ICD-10-CM | POA: Diagnosis not present

## 2012-06-28 DIAGNOSIS — G500 Trigeminal neuralgia: Secondary | ICD-10-CM | POA: Diagnosis not present

## 2012-07-11 ENCOUNTER — Other Ambulatory Visit: Payer: Self-pay | Admitting: Dermatology

## 2012-07-11 DIAGNOSIS — H61009 Unspecified perichondritis of external ear, unspecified ear: Secondary | ICD-10-CM | POA: Diagnosis not present

## 2012-07-11 DIAGNOSIS — L723 Sebaceous cyst: Secondary | ICD-10-CM | POA: Diagnosis not present

## 2012-07-16 ENCOUNTER — Other Ambulatory Visit (HOSPITAL_COMMUNITY): Payer: Self-pay | Admitting: *Deleted

## 2012-07-17 ENCOUNTER — Encounter (HOSPITAL_COMMUNITY)
Admission: RE | Admit: 2012-07-17 | Discharge: 2012-07-17 | Disposition: A | Discharge status: Home / Self Care | Payer: Medicare Other | Source: Ambulatory Visit | Attending: Internal Medicine | Admitting: Internal Medicine

## 2012-07-26 ENCOUNTER — Encounter (HOSPITAL_COMMUNITY)
Admission: RE | Admit: 2012-07-26 | Discharge: 2012-07-26 | Disposition: A | Discharge status: Home / Self Care | Payer: Medicare Other | Source: Ambulatory Visit | Attending: Internal Medicine | Admitting: Internal Medicine

## 2012-07-26 ENCOUNTER — Encounter (HOSPITAL_COMMUNITY): Payer: Self-pay

## 2012-07-26 DIAGNOSIS — M810 Age-related osteoporosis without current pathological fracture: Secondary | ICD-10-CM | POA: Insufficient documentation

## 2012-07-26 HISTORY — DX: Age-related osteoporosis without current pathological fracture: M81.0

## 2012-07-26 HISTORY — DX: Unspecified atrial fibrillation: I48.91

## 2012-07-26 MED ORDER — SODIUM CHLORIDE 0.9 % IV SOLN
Freq: Once | INTRAVENOUS | Status: AC
  Administered 2012-07-26: 14:00:00 via INTRAVENOUS

## 2012-07-26 MED ORDER — ZOLEDRONIC ACID 5 MG/100ML IV SOLN
5.0000 mg | Freq: Once | INTRAVENOUS | Status: AC
  Administered 2012-07-26: 5 mg via INTRAVENOUS
  Filled 2012-07-26: qty 100

## 2012-09-30 DIAGNOSIS — Z23 Encounter for immunization: Secondary | ICD-10-CM | POA: Diagnosis not present

## 2012-10-01 DIAGNOSIS — L02419 Cutaneous abscess of limb, unspecified: Secondary | ICD-10-CM | POA: Diagnosis not present

## 2012-10-01 DIAGNOSIS — B353 Tinea pedis: Secondary | ICD-10-CM | POA: Diagnosis not present

## 2012-10-01 DIAGNOSIS — L03119 Cellulitis of unspecified part of limb: Secondary | ICD-10-CM | POA: Diagnosis not present

## 2012-10-28 DIAGNOSIS — F341 Dysthymic disorder: Secondary | ICD-10-CM | POA: Diagnosis not present

## 2012-10-28 DIAGNOSIS — R002 Palpitations: Secondary | ICD-10-CM | POA: Diagnosis not present

## 2012-10-28 DIAGNOSIS — I10 Essential (primary) hypertension: Secondary | ICD-10-CM | POA: Diagnosis not present

## 2012-10-28 DIAGNOSIS — K219 Gastro-esophageal reflux disease without esophagitis: Secondary | ICD-10-CM | POA: Diagnosis not present

## 2012-10-28 DIAGNOSIS — I4891 Unspecified atrial fibrillation: Secondary | ICD-10-CM | POA: Diagnosis not present

## 2012-10-28 DIAGNOSIS — R0789 Other chest pain: Secondary | ICD-10-CM | POA: Diagnosis not present

## 2012-11-07 DIAGNOSIS — J069 Acute upper respiratory infection, unspecified: Secondary | ICD-10-CM | POA: Diagnosis not present

## 2013-01-28 DIAGNOSIS — M949 Disorder of cartilage, unspecified: Secondary | ICD-10-CM | POA: Diagnosis not present

## 2013-01-28 DIAGNOSIS — M899 Disorder of bone, unspecified: Secondary | ICD-10-CM | POA: Diagnosis not present

## 2013-01-28 DIAGNOSIS — Z79899 Other long term (current) drug therapy: Secondary | ICD-10-CM | POA: Diagnosis not present

## 2013-02-04 DIAGNOSIS — IMO0002 Reserved for concepts with insufficient information to code with codable children: Secondary | ICD-10-CM | POA: Diagnosis not present

## 2013-02-04 DIAGNOSIS — Z23 Encounter for immunization: Secondary | ICD-10-CM | POA: Diagnosis not present

## 2013-02-04 DIAGNOSIS — M1990 Unspecified osteoarthritis, unspecified site: Secondary | ICD-10-CM | POA: Diagnosis not present

## 2013-02-04 DIAGNOSIS — Z1212 Encounter for screening for malignant neoplasm of rectum: Secondary | ICD-10-CM | POA: Diagnosis not present

## 2013-02-04 DIAGNOSIS — M949 Disorder of cartilage, unspecified: Secondary | ICD-10-CM | POA: Diagnosis not present

## 2013-02-04 DIAGNOSIS — F321 Major depressive disorder, single episode, moderate: Secondary | ICD-10-CM | POA: Diagnosis not present

## 2013-02-04 DIAGNOSIS — Z0000 Encounter for general adult medical examination without abnormal findings: Secondary | ICD-10-CM | POA: Diagnosis not present

## 2013-02-04 DIAGNOSIS — M899 Disorder of bone, unspecified: Secondary | ICD-10-CM | POA: Diagnosis not present

## 2013-02-04 DIAGNOSIS — Z79899 Other long term (current) drug therapy: Secondary | ICD-10-CM | POA: Diagnosis not present

## 2013-02-04 DIAGNOSIS — M546 Pain in thoracic spine: Secondary | ICD-10-CM | POA: Diagnosis not present

## 2013-02-18 DIAGNOSIS — H43819 Vitreous degeneration, unspecified eye: Secondary | ICD-10-CM | POA: Diagnosis not present

## 2013-02-18 DIAGNOSIS — R079 Chest pain, unspecified: Secondary | ICD-10-CM | POA: Diagnosis not present

## 2013-02-18 DIAGNOSIS — H04129 Dry eye syndrome of unspecified lacrimal gland: Secondary | ICD-10-CM | POA: Diagnosis not present

## 2013-02-18 DIAGNOSIS — I10 Essential (primary) hypertension: Secondary | ICD-10-CM | POA: Insufficient documentation

## 2013-02-18 DIAGNOSIS — H526 Other disorders of refraction: Secondary | ICD-10-CM | POA: Diagnosis not present

## 2013-02-18 DIAGNOSIS — H2510 Age-related nuclear cataract, unspecified eye: Secondary | ICD-10-CM | POA: Diagnosis not present

## 2013-02-19 ENCOUNTER — Other Ambulatory Visit: Payer: Self-pay | Admitting: Orthopedic Surgery

## 2013-02-19 ENCOUNTER — Ambulatory Visit
Admission: RE | Admit: 2013-02-19 | Discharge: 2013-02-19 | Disposition: A | Discharge status: Home / Self Care | Payer: Medicare Other | Source: Ambulatory Visit | Attending: Orthopedic Surgery | Admitting: Orthopedic Surgery

## 2013-02-19 DIAGNOSIS — R52 Pain, unspecified: Secondary | ICD-10-CM

## 2013-02-19 DIAGNOSIS — R9389 Abnormal findings on diagnostic imaging of other specified body structures: Secondary | ICD-10-CM | POA: Diagnosis not present

## 2013-02-24 DIAGNOSIS — K219 Gastro-esophageal reflux disease without esophagitis: Secondary | ICD-10-CM | POA: Diagnosis not present

## 2013-02-24 DIAGNOSIS — F411 Generalized anxiety disorder: Secondary | ICD-10-CM | POA: Diagnosis not present

## 2013-02-24 DIAGNOSIS — I10 Essential (primary) hypertension: Secondary | ICD-10-CM | POA: Diagnosis not present

## 2013-02-24 DIAGNOSIS — R0602 Shortness of breath: Secondary | ICD-10-CM | POA: Diagnosis not present

## 2013-02-24 DIAGNOSIS — Z79899 Other long term (current) drug therapy: Secondary | ICD-10-CM | POA: Diagnosis not present

## 2013-02-24 DIAGNOSIS — I4891 Unspecified atrial fibrillation: Secondary | ICD-10-CM | POA: Diagnosis not present

## 2013-02-24 DIAGNOSIS — Z7982 Long term (current) use of aspirin: Secondary | ICD-10-CM | POA: Diagnosis not present

## 2013-04-07 DIAGNOSIS — F411 Generalized anxiety disorder: Secondary | ICD-10-CM | POA: Diagnosis not present

## 2013-04-07 DIAGNOSIS — I369 Nonrheumatic tricuspid valve disorder, unspecified: Secondary | ICD-10-CM | POA: Diagnosis not present

## 2013-04-07 DIAGNOSIS — I10 Essential (primary) hypertension: Secondary | ICD-10-CM | POA: Diagnosis not present

## 2013-04-07 DIAGNOSIS — R0602 Shortness of breath: Secondary | ICD-10-CM | POA: Diagnosis not present

## 2013-04-07 DIAGNOSIS — I079 Rheumatic tricuspid valve disease, unspecified: Secondary | ICD-10-CM | POA: Diagnosis not present

## 2013-04-07 DIAGNOSIS — R0789 Other chest pain: Secondary | ICD-10-CM | POA: Diagnosis not present

## 2013-04-07 DIAGNOSIS — K219 Gastro-esophageal reflux disease without esophagitis: Secondary | ICD-10-CM | POA: Diagnosis not present

## 2013-04-07 DIAGNOSIS — R5381 Other malaise: Secondary | ICD-10-CM | POA: Diagnosis not present

## 2013-04-07 DIAGNOSIS — I4891 Unspecified atrial fibrillation: Secondary | ICD-10-CM | POA: Diagnosis not present

## 2013-04-07 DIAGNOSIS — I059 Rheumatic mitral valve disease, unspecified: Secondary | ICD-10-CM | POA: Diagnosis not present

## 2013-04-10 DIAGNOSIS — L821 Other seborrheic keratosis: Secondary | ICD-10-CM | POA: Diagnosis not present

## 2013-04-10 DIAGNOSIS — Z85828 Personal history of other malignant neoplasm of skin: Secondary | ICD-10-CM | POA: Diagnosis not present

## 2013-04-10 DIAGNOSIS — I789 Disease of capillaries, unspecified: Secondary | ICD-10-CM | POA: Diagnosis not present

## 2013-04-10 DIAGNOSIS — L819 Disorder of pigmentation, unspecified: Secondary | ICD-10-CM | POA: Diagnosis not present

## 2013-04-10 DIAGNOSIS — D1801 Hemangioma of skin and subcutaneous tissue: Secondary | ICD-10-CM | POA: Diagnosis not present

## 2013-04-10 DIAGNOSIS — M7130 Other bursal cyst, unspecified site: Secondary | ICD-10-CM | POA: Diagnosis not present

## 2013-04-10 DIAGNOSIS — W57XXXA Bitten or stung by nonvenomous insect and other nonvenomous arthropods, initial encounter: Secondary | ICD-10-CM | POA: Diagnosis not present

## 2013-04-10 DIAGNOSIS — T148 Other injury of unspecified body region: Secondary | ICD-10-CM | POA: Diagnosis not present

## 2013-04-10 DIAGNOSIS — H61009 Unspecified perichondritis of external ear, unspecified ear: Secondary | ICD-10-CM | POA: Diagnosis not present

## 2013-07-24 DIAGNOSIS — IMO0002 Reserved for concepts with insufficient information to code with codable children: Secondary | ICD-10-CM | POA: Diagnosis not present

## 2013-07-24 DIAGNOSIS — M899 Disorder of bone, unspecified: Secondary | ICD-10-CM | POA: Diagnosis not present

## 2013-07-24 DIAGNOSIS — Z23 Encounter for immunization: Secondary | ICD-10-CM | POA: Diagnosis not present

## 2013-07-24 DIAGNOSIS — M546 Pain in thoracic spine: Secondary | ICD-10-CM | POA: Diagnosis not present

## 2013-07-24 DIAGNOSIS — K219 Gastro-esophageal reflux disease without esophagitis: Secondary | ICD-10-CM | POA: Diagnosis not present

## 2013-07-24 DIAGNOSIS — F321 Major depressive disorder, single episode, moderate: Secondary | ICD-10-CM | POA: Diagnosis not present

## 2013-07-24 DIAGNOSIS — F411 Generalized anxiety disorder: Secondary | ICD-10-CM | POA: Diagnosis not present

## 2013-07-24 DIAGNOSIS — K589 Irritable bowel syndrome without diarrhea: Secondary | ICD-10-CM | POA: Diagnosis not present

## 2013-08-07 ENCOUNTER — Encounter: Payer: Self-pay | Admitting: Obstetrics & Gynecology

## 2013-08-07 ENCOUNTER — Ambulatory Visit: Payer: Self-pay | Admitting: Obstetrics & Gynecology

## 2013-08-08 ENCOUNTER — Ambulatory Visit (INDEPENDENT_AMBULATORY_CARE_PROVIDER_SITE_OTHER): Payer: Medicare Other | Admitting: Obstetrics & Gynecology

## 2013-08-08 ENCOUNTER — Encounter: Payer: Self-pay | Admitting: Obstetrics & Gynecology

## 2013-08-08 VITALS — BP 118/68 | HR 60 | Resp 16 | Ht 66.0 in | Wt 131.2 lb

## 2013-08-08 DIAGNOSIS — Z124 Encounter for screening for malignant neoplasm of cervix: Secondary | ICD-10-CM

## 2013-08-08 DIAGNOSIS — Z01419 Encounter for gynecological examination (general) (routine) without abnormal findings: Secondary | ICD-10-CM | POA: Diagnosis not present

## 2013-08-08 DIAGNOSIS — M479 Spondylosis, unspecified: Secondary | ICD-10-CM

## 2013-08-08 DIAGNOSIS — N390 Urinary tract infection, site not specified: Secondary | ICD-10-CM | POA: Diagnosis not present

## 2013-08-08 NOTE — Progress Notes (Signed)
72 y.o. G1P0010 MarriedCaucasianF here for annual exam.  Doing well.  States she feels she is turning into a little old lady.  Husband still working on home in [place].  He bought a house built by [employer].  This house was going to destroyed.  Husband now working on this.  Pt and I discussed there was just an article in [employer] about this Technical sales engineer.  Saw Dr. Wylene Simmer about two weeks ago.  Blood work done in June.  Having some issues with arthritis with back.  Saw ortho who took an x-ray of back.  Thought the arthritis looked like rheumatoid arthritis.  CT was negative.  Was on steroid for 7 days.  Pain did get much better.  Saw Dr. Juliene Pina.    Having third Reclast injection next week.  BMD 1 year.  H/O LE rash.  Has seen Dr. Terri Piedra for this.    Patient's last menstrual period was 09/19/1995.          Sexually active: no  The current method of family planning is none.    Exercising: yes  infrequent senior sneaker classes Smoker:  no  Health Maintenance: Pap:  06/15/11 WNL History of abnormal Pap:  no MMG:  04/08/12 3D normal Colonoscopy:  2009 repeat in 10 years, 5/09, Dr. Juanda Chance BMD:   2013, -2.0/-1.6 TDaP:  10/13 Screening Labs: PCP, Hb today: PCP, Urine today: PCP   reports that she has never smoked. She has never used smokeless tobacco. She reports that she does not drink alcohol or use illicit drugs.  Past Medical History  Diagnosis Date  . Osteoporosis   . A-fib   . Atrophic vaginitis   . Chronic constipation 2009  . 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 Coccyx fracture surgery  09/2003    Current Outpatient Prescriptions  Medication Sig Dispense Refill  . aspirin 81 MG tablet Take 81 mg by mouth daily.      Marland Kitchen Bioflavonoid Products (ESTER C PO) Take by mouth.      Marland Kitchen BIOTIN PO Take by mouth.      . Calcium Carb-Cholecalciferol (CALCIUM 1000 + D PO) Take by mouth. Two caps daily      . Cholecalciferol (VITAMIN  D) 2000 UNITS CAPS Take by mouth.      . EVENING PRIMROSE OIL PO Take by mouth.      Marland Kitchen glucosamine-chondroitin 500-400 MG tablet Take 1 tablet by mouth 3 (three) times daily.      . hydrochlorothiazide (MICROZIDE) 12.5 MG capsule       . Multiple Vitamins-Minerals (MULTIVITAMIN PO) Take by mouth.      . Omega-3 Fatty Acids (OMEGA 3 PO) Take by mouth.      . Zoledronic Acid (RECLAST IV) Inject into the vein.       No current facility-administered medications for this visit.    Family History  Problem Relation Age of Onset  . Liver cancer Mother     ROS:  Pertinent items are noted in HPI.  Otherwise, a comprehensive ROS was negative.  Exam:   BP 118/68  Pulse 60  Resp 16  Ht 5\' 6"  (1.676 m)  Wt 131 lb 3.2 oz (59.512 kg)  BMI 21.19 kg/m2  LMP 09/19/1995  Weight change: stable  Height: 5\' 6"  (167.6 cm)  Ht Readings from Last 3 Encounters:  08/08/13 5\' 6"  (1.676 m)  06/21/10 5' 6.5" (1.689 m)  06/09/10 5' 6.5" (1.689 m)    General appearance: alert, cooperative and appears stated age Head: Normocephalic, without obvious abnormality, atraumatic Neck: no adenopathy, supple, symmetrical, trachea midline and thyroid normal to inspection and palpation Lungs: clear to auscultation bilaterally Breasts: normal appearance, no masses or tenderness Heart: regular rate and rhythm Abdomen: soft, non-tender; bowel sounds normal; no masses,  no organomegaly Extremities: extremities normal, atraumatic, no cyanosis or edema Skin: Skin color, texture, turgor normal. No rashes or lesions Lymph nodes: Cervical, supraclavicular, and axillary nodes normal. No abnormal inguinal nodes palpated Neurologic: Grossly normal   Pelvic: External genitalia:  no lesions              Urethra:  normal appearing urethra with no masses, tenderness or lesions              Bartholins and Skenes: normal                 Vagina: normal appearing vagina with normal color and discharge, no lesions               Cervix: no lesions              Pap taken: yes Bimanual Exam:  Uterus:  normal size, contour, position, consistency, mobility, non-tender              Adnexa: normal adnexa and no mass, fullness, tenderness               Rectovaginal: Confirms               Anus:  normal sphincter tone, no lesions  A:  Well Woman with normal exam PMP, no HRT Vaginal atrophic changes GERD.  Worsening hoarseness.  Has seen Dr. Juanda Chance.  Aware needs to call.   Osteopenia, on reclast (appt is scheduled) Afib [place] leg rash H/O UTI  P:   Mammogram due pap smear today Labs with Dr. Wylene Simmer Pt will f/u with Dr. Juanda Chance about GERD and Dr. Nicholas Lose about leg rash. Referral to Dr. Charlett Blake about back pain/arthritis. Urine culture return annually or prn  An After Visit Summary was printed and given to the patient.

## 2013-08-08 NOTE — Patient Instructions (Signed)

## 2013-08-10 LAB — URINE CULTURE
Colony Count: NO GROWTH
Organism ID, Bacteria: NO GROWTH

## 2013-08-11 LAB — IPS PAP SMEAR ONLY

## 2013-08-21 ENCOUNTER — Encounter (HOSPITAL_COMMUNITY): Payer: Self-pay

## 2013-08-21 ENCOUNTER — Other Ambulatory Visit (HOSPITAL_COMMUNITY): Payer: Self-pay | Admitting: Internal Medicine

## 2013-08-21 ENCOUNTER — Ambulatory Visit (HOSPITAL_COMMUNITY)
Admission: RE | Admit: 2013-08-21 | Discharge: 2013-08-21 | Disposition: A | Discharge status: Home / Self Care | Payer: Medicare Other | Source: Ambulatory Visit | Attending: Internal Medicine | Admitting: Internal Medicine

## 2013-08-21 DIAGNOSIS — M810 Age-related osteoporosis without current pathological fracture: Secondary | ICD-10-CM | POA: Diagnosis not present

## 2013-08-21 MED ORDER — ZOLEDRONIC ACID 5 MG/100ML IV SOLN
5.0000 mg | Freq: Once | INTRAVENOUS | Status: AC
  Administered 2013-08-21: 5 mg via INTRAVENOUS
  Filled 2013-08-21: qty 100

## 2013-08-21 MED ORDER — SODIUM CHLORIDE 0.9 % IV SOLN
Freq: Once | INTRAVENOUS | Status: AC
  Administered 2013-08-21: 13:00:00 via INTRAVENOUS

## 2013-08-29 ENCOUNTER — Other Ambulatory Visit: Payer: Self-pay | Admitting: Dermatology

## 2013-08-29 DIAGNOSIS — Z85828 Personal history of other malignant neoplasm of skin: Secondary | ICD-10-CM | POA: Diagnosis not present

## 2013-08-29 DIAGNOSIS — L259 Unspecified contact dermatitis, unspecified cause: Secondary | ICD-10-CM | POA: Diagnosis not present

## 2013-10-15 ENCOUNTER — Encounter: Payer: Self-pay | Admitting: Gynecology

## 2013-10-15 ENCOUNTER — Ambulatory Visit (INDEPENDENT_AMBULATORY_CARE_PROVIDER_SITE_OTHER): Payer: Medicare Other | Admitting: Gynecology

## 2013-10-15 ENCOUNTER — Telehealth: Payer: Self-pay | Admitting: Obstetrics & Gynecology

## 2013-10-15 VITALS — BP 133/76 | HR 84 | Temp 97.9°F | Resp 18 | Ht 66.0 in | Wt 131.0 lb

## 2013-10-15 DIAGNOSIS — R3915 Urgency of urination: Secondary | ICD-10-CM | POA: Diagnosis not present

## 2013-10-15 DIAGNOSIS — N368 Other specified disorders of urethra: Secondary | ICD-10-CM | POA: Diagnosis not present

## 2013-10-15 DIAGNOSIS — K644 Residual hemorrhoidal skin tags: Secondary | ICD-10-CM

## 2013-10-15 DIAGNOSIS — N950 Postmenopausal bleeding: Secondary | ICD-10-CM | POA: Diagnosis not present

## 2013-10-15 DIAGNOSIS — R21 Rash and other nonspecific skin eruption: Secondary | ICD-10-CM

## 2013-10-15 LAB — POCT URINALYSIS DIPSTICK
RBC UA: 2
Urobilinogen, UA: NEGATIVE
pH, UA: 5

## 2013-10-15 MED ORDER — PHENAZOPYRIDINE HCL 200 MG PO TABS: 200.0000 mg | ORAL_TABLET | Freq: Three times a day (TID) | ORAL | Status: DC | PRN

## 2013-10-15 MED ORDER — CIPROFLOXACIN HCL 500 MG PO TABS: 500.0000 mg | ORAL_TABLET | Freq: Two times a day (BID) | ORAL | Status: DC

## 2013-10-15 MED ORDER — NYSTATIN-TRIAMCINOLONE 100000-0.1 UNIT/GM-% EX OINT: 1.0000 "application " | TOPICAL_OINTMENT | Freq: Two times a day (BID) | CUTANEOUS | Status: DC

## 2013-10-15 NOTE — Telephone Encounter (Signed)
Spoke with patient. She states today she noticed irritation outside of her vaginal area and urinary pressure. States that she feels pressure during urination. Denies fevers, denies flank pain. States "I just don't feel well today." Offered office visit today with Dr. Charlies Constable and patient is agreeable. Appointment scheduled, Dr. Charlies Constable aware and patient to come today for appointment.   Routing to provider for final review. Patient agreeable to disposition. Will close encounter

## 2013-10-15 NOTE — Telephone Encounter (Signed)
Patient called during lunch saying she needed an appt that she had a problem did not state the problem. i tried to call to get more information no answer. Said she would schedule with patty or miller

## 2013-10-15 NOTE — Progress Notes (Signed)
Subjective:     Patient ID: April Decker, female   DOB: 04/06/1941, 72 y.o.   MRN: [id number]  HPI Comments: Pt here complaining of burning and pressure with voiding. Pt reports that 67m ago she wad some cramping for a day and noticed some mucous like the first day of her cycle but she denies any frank blood, no bleeding since.  Pt has not been sexually active for over a year, due to ED. Pt reports that she noticed a bump near her rectum and when she was looking with a mirror she thought her vagina looked very red but felt no difference. Pt reports recent recurrence of rash that was biospied twice two different times, was told it was viral in nature, treated with cream and eventually resolved    Review of Systems  Constitutional: Negative for fever, chills and fatigue.  Genitourinary: Positive for dysuria and vaginal bleeding. Negative for flank pain, vaginal discharge, vaginal pain and pelvic pain.       Objective:   Physical Exam  Nursing note and vitals reviewed. Constitutional: She is oriented to person, place, and time. She appears well-developed and well-nourished.  Abdominal: Soft. She exhibits no distension. There is no tenderness.  Genitourinary:     Neurological: She is alert and oriented to person, place, and time.  Skin: Skin is warm and dry.  vagina: atrophic, smooth, no lesions no blood, anterior wall tenderness Cervix: atrophic, no blood Uterus: atrophic mobile nontender Adnexa: not palpable     Assessment:     Possible UTI prolapsed urethral mucosa Post-menopausal bleed by history dermatitis Skin tag      Plan:     Urine for culture, cipro and pyridium sent in  PUS to evaluate lining mycolog ointment to perineum, to keep dry Prolapsed mucosa new? Source of one day mucoid dark blood?  Can address after bleeding evaluated-pt agreeable Pt assured re skin tag-no need to treat.

## 2013-10-16 ENCOUNTER — Telehealth: Payer: Self-pay | Admitting: *Deleted

## 2013-10-16 ENCOUNTER — Other Ambulatory Visit: Payer: Self-pay | Admitting: *Deleted

## 2013-10-16 ENCOUNTER — Telehealth: Payer: Self-pay | Admitting: Gynecology

## 2013-10-16 DIAGNOSIS — I10 Essential (primary) hypertension: Secondary | ICD-10-CM | POA: Diagnosis not present

## 2013-10-16 DIAGNOSIS — N950 Postmenopausal bleeding: Secondary | ICD-10-CM

## 2013-10-16 LAB — URINE CULTURE
Colony Count: NO GROWTH
Organism ID, Bacteria: NO GROWTH

## 2013-10-16 NOTE — Telephone Encounter (Signed)
Left message for patient to call back  

## 2013-10-16 NOTE — Telephone Encounter (Signed)
Patient returned call/ advised patient that per insurance quote, she will have 0 liability for PUS/ scheduled PUS/ advised of cancellation policy and cancellation fee//ssf

## 2013-10-16 NOTE — Telephone Encounter (Signed)
Call to patient to notify PUS appt adjusted time to 2pm and can see Dr Sabra Heck at 230. LMTCB.

## 2013-10-17 ENCOUNTER — Telehealth: Payer: Self-pay | Admitting: Emergency Medicine

## 2013-10-17 NOTE — Telephone Encounter (Signed)
Spoke with patient and message from Dr. Sabra Heck given. Verbalized understanding and will follow up on Tuesday.

## 2013-10-17 NOTE — Telephone Encounter (Signed)
Advise if asymptomatic after 3 days, ok to stop.  We can reassess symptoms on Tues to see if needs anything else.  Should not be any problem with heart and Cipro.

## 2013-10-17 NOTE — Telephone Encounter (Signed)
Spoke with patient. She is feeling improved and has taken only two doses of the cipro out of the 7 day treatment because she was waiting to see her cardiologist to make sure Cipro was okay for her to take. She states she is feeling much better after the doses. Should she continue Cipro until PUS and follow up with Dr. Sabra Heck?

## 2013-10-17 NOTE — Telephone Encounter (Signed)
Message copied by Michele Mcalpine on Fri Oct 17, 2013 10:45 AM ------      Message from: Elveria Rising      Created: Fri Oct 17, 2013  8:31 AM       Inform no urinary infection, can stop or finish Rx (last day), ask if feeling any better please ------

## 2013-10-17 NOTE — Telephone Encounter (Signed)
Patient returning Sally's call. °

## 2013-10-17 NOTE — Telephone Encounter (Signed)
Patient aware of time change. Will close encounter.

## 2013-10-21 ENCOUNTER — Other Ambulatory Visit: Payer: Medicare Other

## 2013-10-21 ENCOUNTER — Other Ambulatory Visit: Payer: Medicare Other | Admitting: Gynecology

## 2013-10-21 ENCOUNTER — Ambulatory Visit (INDEPENDENT_AMBULATORY_CARE_PROVIDER_SITE_OTHER): Payer: Medicare Other

## 2013-10-21 ENCOUNTER — Ambulatory Visit (INDEPENDENT_AMBULATORY_CARE_PROVIDER_SITE_OTHER): Payer: Medicare Other | Admitting: Obstetrics & Gynecology

## 2013-10-21 VITALS — BP 122/78 | Ht 66.0 in | Wt 129.6 lb

## 2013-10-21 DIAGNOSIS — N950 Postmenopausal bleeding: Secondary | ICD-10-CM

## 2013-10-21 NOTE — Progress Notes (Signed)
73 y.o. G1P0 Marriedfemale here for a pelvic ultrasound due to episode of PMP bleeding.  Pt saw Dr. Charlies Constable for this.  Has prolapsed urethral mucosa and this was thought to be the source but ultrasound recommended.  Pt here for this today.  Patient's last menstrual period was 09/19/1995.  Sexually active:  yes  Contraception: no method, PMP  FINDINGS: UTERUS: 5.5 x 3.5 x 1.7cm EMS: 1.23mm with sliver of fluid within endometrial cavity ADNEXA:   Left ovary 2.5 x 1.2 x 1.1cm   Right ovary 2.2 x 1.2 x 1.1cm.  Both appear atrophic CUL DE SAC: no free fluid  Dicussed with patient finding of fluid.  Images reviewed.  Endometrial tissue is very thin and symmetric.  Doubtful of any pathology.  Do not feel biopsy is warranted or will give any results.  She is comfortable with this.  Treatment for prolapse urethral mucosa discussed including topical olive oil vs estrogen cream in very small amount.  Patient has not had any further bleeding.  Will continue to monitor and call with issues/concerns.  Assessment:  PMP bleeding, u/s essentially normal except for small amount of endometrial fluid Plan: Pt will monitor and call with any new issues.  ~15 minutes spent with patient >50% of time was in face to face discussion of above.

## 2013-10-30 DIAGNOSIS — K130 Diseases of lips: Secondary | ICD-10-CM | POA: Diagnosis not present

## 2013-10-30 DIAGNOSIS — L905 Scar conditions and fibrosis of skin: Secondary | ICD-10-CM | POA: Diagnosis not present

## 2013-10-30 DIAGNOSIS — Z85828 Personal history of other malignant neoplasm of skin: Secondary | ICD-10-CM | POA: Diagnosis not present

## 2013-11-03 ENCOUNTER — Encounter: Payer: Self-pay | Admitting: Obstetrics & Gynecology

## 2013-11-17 DIAGNOSIS — K589 Irritable bowel syndrome without diarrhea: Secondary | ICD-10-CM | POA: Diagnosis not present

## 2013-11-17 DIAGNOSIS — R109 Unspecified abdominal pain: Secondary | ICD-10-CM | POA: Diagnosis not present

## 2013-11-17 DIAGNOSIS — IMO0002 Reserved for concepts with insufficient information to code with codable children: Secondary | ICD-10-CM | POA: Diagnosis not present

## 2013-11-23 ENCOUNTER — Encounter (HOSPITAL_COMMUNITY): Payer: Self-pay | Admitting: Emergency Medicine

## 2013-11-23 ENCOUNTER — Emergency Department (HOSPITAL_COMMUNITY)
Admission: EM | Admit: 2013-11-23 | Discharge: 2013-11-23 | Disposition: A | Discharge status: Home / Self Care | Payer: Medicare Other | Attending: Emergency Medicine | Admitting: Emergency Medicine

## 2013-11-23 DIAGNOSIS — F29 Unspecified psychosis not due to a substance or known physiological condition: Secondary | ICD-10-CM | POA: Diagnosis not present

## 2013-11-23 DIAGNOSIS — F411 Generalized anxiety disorder: Secondary | ICD-10-CM | POA: Diagnosis not present

## 2013-11-23 DIAGNOSIS — M810 Age-related osteoporosis without current pathological fracture: Secondary | ICD-10-CM | POA: Insufficient documentation

## 2013-11-23 DIAGNOSIS — Z8742 Personal history of other diseases of the female genital tract: Secondary | ICD-10-CM | POA: Diagnosis not present

## 2013-11-23 DIAGNOSIS — M129 Arthropathy, unspecified: Secondary | ICD-10-CM | POA: Insufficient documentation

## 2013-11-23 DIAGNOSIS — R002 Palpitations: Secondary | ICD-10-CM

## 2013-11-23 DIAGNOSIS — Z7982 Long term (current) use of aspirin: Secondary | ICD-10-CM | POA: Insufficient documentation

## 2013-11-23 DIAGNOSIS — Z79899 Other long term (current) drug therapy: Secondary | ICD-10-CM | POA: Insufficient documentation

## 2013-11-23 DIAGNOSIS — Z8679 Personal history of other diseases of the circulatory system: Secondary | ICD-10-CM | POA: Diagnosis not present

## 2013-11-23 LAB — BASIC METABOLIC PANEL
BUN: 9 mg/dL (ref 6–23)
CHLORIDE: 97 meq/L (ref 96–112)
CO2: 27 mEq/L (ref 19–32)
Calcium: 9.4 mg/dL (ref 8.4–10.5)
Creatinine, Ser: 0.76 mg/dL (ref 0.50–1.10)
GFR calc non Af Amer: 81 mL/min — ABNORMAL LOW (ref >90)
GLUCOSE: 105 mg/dL — AB (ref 70–99)
POTASSIUM: 4.4 meq/L (ref 3.7–5.3)
SODIUM: 135 meq/L — AB (ref 137–147)

## 2013-11-23 LAB — CBC
HEMATOCRIT: 35 % — AB (ref 36.0–46.0)
HEMOGLOBIN: 12.6 g/dL (ref 12.0–15.0)
MCH: 31.3 pg (ref 26.0–34.0)
MCHC: 36 g/dL (ref 30.0–36.0)
MCV: 87.1 fL (ref 78.0–100.0)
Platelets: 278 10*3/uL (ref 150–400)
RBC: 4.02 MIL/uL (ref 3.87–5.11)
RDW: 12.9 % (ref 11.5–15.5)
WBC: 6.7 10*3/uL (ref 4.0–10.5)

## 2013-11-23 NOTE — ED Notes (Signed)
Patient states she will rest more.

## 2013-11-23 NOTE — ED Notes (Signed)
Per EMS - pt comes from home where she lives with her husband.  EMS states call originally came out as chest pain, but pt denies.  Pt has hx of a-fib, but EKG was unremarkable.  Pt denies N/V, dizziness and SOB.  Pt was sinus tach initially on monitor, but is now sinus.  Pt is having trouble dealing with the impending death of one of her cats.  Pt has hx of anxiety, but is not taking medications for same.  Pt's BP on scene was 342 systolic, 876/81 was final, HR 92, 100% on RA.

## 2013-11-23 NOTE — ED Notes (Signed)
Bed: [facility]4 Expected date:  Expected time:  Means of arrival:  Comments: EMS, stressed out

## 2013-11-23 NOTE — ED Provider Notes (Signed)
CSN: [id number]     Arrival date & time 11/23/13  1153 History   First MD Initiated Contact with Patient 11/23/13 1204     Chief Complaint  Patient presents with  . Palpitations     (Consider location/radiation/quality/duration/timing/severity/associated sxs/prior Treatment) Patient is a 73 y.o. female presenting with palpitations. The history is provided by the patient, the spouse and the EMS personnel.  Palpitations Associated symptoms: no back pain, no chest pain, no shortness of breath and no vomiting   pt with hx afib, states felt as if heartbeat was rapid and/or irregular this morning. States has been under a lot of stress lately due to v ill cat whom may need to be put down, was up all night sitting w cat, this morning felt as if heart was skipping, irregular or fast. States was somewhat similar to when she had episode afib 2-3 x in past. States currently on no meds for afib.  No recent change in meds or new meds, x for cipro which pcp had given as pt had mentioned some mild constipation and LLQ pain, ?hx diverticula. Current denies abd pain, no fevers. w palpitations, pt denies any chest pain or discomfort. No sob or unusual doe. No fainting episodes or lightheadedness.  Denies heavy caffeine use or any stimulant medication use.  States other than above recent health at baseline.     Past Medical History  Diagnosis Date  . Osteoporosis   . A-fib   . Atrophic vaginitis   . Chronic constipation 2009  . 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 Rash and nonspecific skin eruption     of arms and legs intermittantly. Relieved w anti-itching cream and lotions   Past Surgical History  Procedure Laterality Date  . Coccyx fracture surgery  09/2003   Family History  Problem Relation Age of Onset  . Liver cancer Mother    History  Substance Use Topics  . Smoking status: Never Smoker   . Smokeless tobacco: Never Used  . Alcohol Use: No   OB History   Grav Para Term Preterm Abortions TAB  SAB Ect Mult Living   1 0 0  1     0     Review of Systems  Constitutional: Negative for fever and chills.  HENT: Negative for sore throat.   Eyes: Negative for redness.  Respiratory: Negative for shortness of breath.   Cardiovascular: Positive for palpitations. Negative for chest pain and leg swelling.  Gastrointestinal: Negative for vomiting, abdominal pain and diarrhea.  Genitourinary: Negative for dysuria and flank pain.  Musculoskeletal: Negative for back pain and neck pain.  Skin: Negative for rash.  Neurological: Negative for syncope and headaches.  Hematological: Does not bruise/bleed easily.  Psychiatric/Behavioral: Negative for confusion.      Allergies  Iohexol; Other; and Shellfish allergy  Home Medications   Current Outpatient Rx  Name  Route  Sig  Dispense  Refill  . aspirin 81 MG tablet   Oral   Take 81 mg by mouth daily.         Marland Kitchen Bioflavonoid Products (ESTER C PO)   Oral   Take 1 tablet by mouth daily.          Marland Kitchen BIOTIN PO   Oral   Take 1 tablet by mouth daily.          . Calcium Carb-Cholecalciferol (CALCIUM 1000 + D PO)   Oral   Take 2 tablets by mouth daily.          Marland Kitchen  Cholecalciferol (VITAMIN D) 2000 UNITS CAPS   Oral   Take 2,000 Units by mouth daily.          . ciprofloxacin (CIPRO) 500 MG tablet   Oral   Take 500 mg by mouth 2 (two) times daily.         Marland Kitchen EVENING PRIMROSE OIL PO   Oral   Take 1 capsule by mouth daily.          Marland Kitchen glucosamine-chondroitin 500-400 MG tablet   Oral   Take 3 tablets by mouth daily.          . hydrochlorothiazide (MICROZIDE) 12.5 MG capsule   Oral   Take 12.5 mg by mouth daily.          . Multiple Vitamins-Minerals (MULTIVITAMIN PO)   Oral   Take by mouth.         . Omega-3 Fatty Acids (OMEGA 3 PO)   Oral   Take 1 capsule by mouth daily.           BP 145/78  Pulse 79  Temp(Src) 98.3 F (36.8 C) (Oral)  Resp 16  SpO2 99%  LMP 09/19/1995 Physical Exam  Nursing note  and vitals reviewed. Constitutional: She is oriented to person, place, and time. She appears well-developed and well-nourished. No distress.  HENT:  Mouth/Throat: Oropharynx is clear and moist.  Eyes: Conjunctivae are normal. No scleral icterus.  Neck: Neck supple. No tracheal deviation present. No thyromegaly present.  Cardiovascular: Normal rate, regular rhythm, normal heart sounds and intact distal pulses.  Exam reveals no gallop and no friction rub.   No murmur heard. Pulmonary/Chest: Effort normal and breath sounds normal. No respiratory distress.  Abdominal: Soft. Normal appearance. She exhibits no distension. There is no tenderness.  Genitourinary:  No cva tenderness  Musculoskeletal: She exhibits no edema and no tenderness.  Neurological: She is alert and oriented to person, place, and time.  Skin: Skin is warm and dry. No rash noted. She is not diaphoretic.  Psychiatric:  Appears mildly stressed/anxious.     ED Course  Procedures (including critical care time)   Results for orders placed during the hospital encounter of 16/10/96  BASIC METABOLIC PANEL      Result Value Ref Range   Sodium 135 (*) 137 - 147 mEq/L   Potassium 4.4  3.7 - 5.3 mEq/L   Chloride 97  96 - 112 mEq/L   CO2 27  19 - 32 mEq/L   Glucose, Bld 105 (*) 70 - 99 mg/dL   BUN 9  6 - 23 mg/dL   Creatinine, Ser 0.76  0.50 - 1.10 mg/dL   Calcium 9.4  8.4 - 10.5 mg/dL   GFR calc non Af Amer 81 (*) >90 mL/min   GFR calc Af Amer >90  >90 mL/min  CBC      Result Value Ref Range   WBC 6.7  4.0 - 10.5 K/uL   RBC 4.02  3.87 - 5.11 MIL/uL   Hemoglobin 12.6  12.0 - 15.0 g/dL   HCT 35.0 (*) 36.0 - 46.0 %   MCV 87.1  78.0 - 100.0 fL   MCH 31.3  26.0 - 34.0 pg   MCHC 36.0  30.0 - 36.0 g/dL   RDW 12.9  11.5 - 15.5 %   Platelets 278  150 - 400 K/uL       EKG Interpretation   Date/Time:  Sunday November 23 2013 12:58:24 EDT Ventricular Rate:  71 PR Interval:  172  QRS Duration: 103 QT Interval:  390 QTC  Calculation: 424 R Axis:   67 Text Interpretation:  Sinus rhythm Nonspecific ST abnormality No  significant change since last tracing Confirmed by Ashok Cordia  MD, Lennette Bihari  ([id number]) on 11/23/2013 1:07:57 PM      MDM   Monitor. Ecg. Labs.  Reviewed nursing notes and prior charts for additional history.   Pt remains in nsr, no dysrhythmia noted.  Pt asymptomatic. No palpitations.   No current or recent cp or discomfort of any sort.  Pt appears stable for d/c.     Mirna Mires, MD 11/23/13 [id number]

## 2013-11-23 NOTE — Discharge Instructions (Signed)
Your heart is in a normal rhythm.  Rest. Get adequate sleep. Minimize caffeine use.  Follow up with your doctor in the next few days. Return to ER if worse, persistent fast heart beat, chest pain, trouble breathing, faint, other concern.    Palpitations  A palpitation is the feeling that your heartbeat is irregular or is faster than normal. It may feel like your heart is fluttering or skipping a beat. Palpitations are usually not a serious problem. However, in some cases, you may need further medical evaluation. CAUSES  Palpitations can be caused by:  Smoking.  Caffeine or other stimulants, such as diet pills or energy drinks.  Alcohol.  Stress and anxiety.  Strenuous physical activity.  Fatigue.  Certain medicines.  Heart disease, especially if you have a history of arrhythmias. This includes atrial fibrillation, atrial flutter, or supraventricular tachycardia.  An improperly working pacemaker or defibrillator. DIAGNOSIS  To find the cause of your palpitations, your caregiver will take your history and perform a physical exam. Tests may also be done, including:  Electrocardiography (ECG). This test records the heart's electrical activity.  Cardiac monitoring. This allows your caregiver to monitor your heart rate and rhythm in real time.  Holter monitor. This is a portable device that records your heartbeat and can help diagnose heart arrhythmias. It allows your caregiver to track your heart activity for several days, if needed.  Stress tests by exercise or by giving medicine that makes the heart beat faster. TREATMENT  Treatment of palpitations depends on the cause of your symptoms and can vary greatly. Most cases of palpitations do not require any treatment other than time, relaxation, and monitoring your symptoms. Other causes, such as atrial fibrillation, atrial flutter, or supraventricular tachycardia, usually require further treatment. HOME CARE INSTRUCTIONS    Avoid:  Caffeinated coffee, tea, soft drinks, diet pills, and energy drinks.  Chocolate.  Alcohol.  Stop smoking if you smoke.  Reduce your stress and anxiety. Things that can help you relax include:  A method that measures bodily functions so you can learn to control them (biofeedback).  Yoga.  Meditation.  Physical activity such as swimming, jogging, or walking.  Get plenty of rest and sleep. SEEK MEDICAL CARE IF:   You continue to have a fast or irregular heartbeat beyond 24 hours.  Your palpitations occur more often. SEEK IMMEDIATE MEDICAL CARE IF:  You develop chest pain or shortness of breath.  You have a severe headache.  You feel dizzy, or you faint. MAKE SURE YOU:  Understand these instructions.  Will watch your condition.  Will get help right away if you are not doing well or get worse. Document Released: 09/01/2000 Document Revised: 12/30/2012 Document Reviewed: 11/03/2011 [facility] Patient Information 2014 [place].   Stress Management Stress is a state of physical or mental tension that often results from changes in your life or normal routine. Some common causes of stress are:  Death of a loved one.  Injuries or severe illnesses.  Getting fired or changing jobs.  Moving into a new home. Other causes may be:  Sexual problems.  Business or financial losses.  Taking on a large debt.  Regular conflict with someone at home or at work.  Constant tiredness from lack of sleep. It is not just bad things that are stressful. It may be stressful to:  Win the lottery.  Get married.  Buy a new car. The amount of stress that can be easily tolerated varies from person to person. Changes  generally cause stress, regardless of the types of change. Too much stress can affect your health. It may lead to physical or emotional problems. Too little stress (boredom) may also become stressful. SUGGESTIONS TO REDUCE STRESS:  Talk things over  with your family and friends. It often is helpful to share your concerns and worries. If you feel your problem is serious, you may want to get help from a professional counselor.  Consider your problems one at a time instead of lumping them all together. Trying to take care of everything at once may seem impossible. List all the things you need to do and then start with the most important one. Set a goal to accomplish 2 or 3 things each day. If you expect to do too many in a single day you will naturally fail, causing you to feel even more stressed.  Do not use alcohol or drugs to relieve stress. Although you may feel better for a short time, they do not remove the problems that caused the stress. They can also be habit forming.  Exercise regularly - at least 3 times per week. Physical exercise can help to relieve that "uptight" feeling and will relax you.  The shortest distance between despair and hope is often a good night's sleep.  Go to bed and get up on time allowing yourself time for appointments without being rushed.  Take a short "time-out" period from any stressful situation that occurs during the day. Close your eyes and take some deep breaths. Starting with the muscles in your face, tense them, hold it for a few seconds, then relax. Repeat this with the muscles in your neck, shoulders, hand, stomach, back and legs.  Take good care of yourself. Eat a balanced diet and get plenty of rest.  Schedule time for having fun. Take a break from your daily routine to relax. HOME CARE INSTRUCTIONS   Call if you feel overwhelmed by your problems and feel you can no longer manage them on your own.  Return immediately if you feel like hurting yourself or someone else. Document Released: 02/28/2001 Document Revised: 11/27/2011 Document Reviewed: 04/29/2013 [facility] Patient Information 2014 [place], [place].

## 2014-01-31 ENCOUNTER — Emergency Department (HOSPITAL_COMMUNITY)
Admission: EM | Admit: 2014-01-31 | Discharge: 2014-01-31 | Disposition: A | Discharge status: Home / Self Care | Payer: Medicare Other | Attending: Emergency Medicine | Admitting: Emergency Medicine

## 2014-01-31 ENCOUNTER — Emergency Department (HOSPITAL_COMMUNITY): Payer: Medicare Other

## 2014-01-31 ENCOUNTER — Encounter (HOSPITAL_COMMUNITY): Payer: Self-pay | Admitting: Emergency Medicine

## 2014-01-31 DIAGNOSIS — M79609 Pain in unspecified limb: Secondary | ICD-10-CM | POA: Insufficient documentation

## 2014-01-31 DIAGNOSIS — I160 Hypertensive urgency: Secondary | ICD-10-CM

## 2014-01-31 DIAGNOSIS — R110 Nausea: Secondary | ICD-10-CM | POA: Diagnosis not present

## 2014-01-31 DIAGNOSIS — Z8719 Personal history of other diseases of the digestive system: Secondary | ICD-10-CM | POA: Diagnosis not present

## 2014-01-31 DIAGNOSIS — R51 Headache: Secondary | ICD-10-CM | POA: Insufficient documentation

## 2014-01-31 DIAGNOSIS — R5383 Other fatigue: Secondary | ICD-10-CM

## 2014-01-31 DIAGNOSIS — M129 Arthropathy, unspecified: Secondary | ICD-10-CM | POA: Diagnosis not present

## 2014-01-31 DIAGNOSIS — Z8742 Personal history of other diseases of the female genital tract: Secondary | ICD-10-CM | POA: Insufficient documentation

## 2014-01-31 DIAGNOSIS — Z79899 Other long term (current) drug therapy: Secondary | ICD-10-CM | POA: Insufficient documentation

## 2014-01-31 DIAGNOSIS — Z7982 Long term (current) use of aspirin: Secondary | ICD-10-CM | POA: Insufficient documentation

## 2014-01-31 DIAGNOSIS — J449 Chronic obstructive pulmonary disease, unspecified: Secondary | ICD-10-CM | POA: Diagnosis not present

## 2014-01-31 DIAGNOSIS — R5381 Other malaise: Secondary | ICD-10-CM | POA: Diagnosis not present

## 2014-01-31 DIAGNOSIS — R4182 Altered mental status, unspecified: Secondary | ICD-10-CM | POA: Diagnosis not present

## 2014-01-31 DIAGNOSIS — Z872 Personal history of diseases of the skin and subcutaneous tissue: Secondary | ICD-10-CM | POA: Diagnosis not present

## 2014-01-31 DIAGNOSIS — I10 Essential (primary) hypertension: Secondary | ICD-10-CM | POA: Insufficient documentation

## 2014-01-31 LAB — CBC WITH DIFFERENTIAL/PLATELET
Basophils Absolute: 0 10*3/uL (ref 0.0–0.1)
Basophils Relative: 1 % (ref 0–1)
EOS PCT: 2 % (ref 0–5)
Eosinophils Absolute: 0.1 10*3/uL (ref 0.0–0.7)
HEMATOCRIT: 36.7 % (ref 36.0–46.0)
Hemoglobin: 12.8 g/dL (ref 12.0–15.0)
LYMPHS ABS: 2.8 10*3/uL (ref 0.7–4.0)
LYMPHS PCT: 39 % (ref 12–46)
MCH: 31.4 pg (ref 26.0–34.0)
MCHC: 34.9 g/dL (ref 30.0–36.0)
MCV: 90 fL (ref 78.0–100.0)
MONO ABS: 0.8 10*3/uL (ref 0.1–1.0)
Monocytes Relative: 11 % (ref 3–12)
Neutro Abs: 3.5 10*3/uL (ref 1.7–7.7)
Neutrophils Relative %: 47 % (ref 43–77)
Platelets: 281 10*3/uL (ref 150–400)
RBC: 4.08 MIL/uL (ref 3.87–5.11)
RDW: 13.1 % (ref 11.5–15.5)
WBC: 7.3 10*3/uL (ref 4.0–10.5)

## 2014-01-31 LAB — URINALYSIS, ROUTINE W REFLEX MICROSCOPIC
Bilirubin Urine: NEGATIVE
Glucose, UA: NEGATIVE mg/dL
Hgb urine dipstick: NEGATIVE
KETONES UR: NEGATIVE mg/dL
Leukocytes, UA: NEGATIVE
NITRITE: NEGATIVE
Protein, ur: NEGATIVE mg/dL
SPECIFIC GRAVITY, URINE: 1.004 — AB (ref 1.005–1.030)
Urobilinogen, UA: 0.2 mg/dL (ref 0.0–1.0)
pH: 7.5 (ref 5.0–8.0)

## 2014-01-31 LAB — COMPREHENSIVE METABOLIC PANEL
ALT: 22 U/L (ref 0–35)
AST: 33 U/L (ref 0–37)
Albumin: 4 g/dL (ref 3.5–5.2)
Alkaline Phosphatase: 37 U/L — ABNORMAL LOW (ref 39–117)
BUN: 13 mg/dL (ref 6–23)
CALCIUM: 9.7 mg/dL (ref 8.4–10.5)
CO2: 28 mEq/L (ref 19–32)
CREATININE: 0.69 mg/dL (ref 0.50–1.10)
Chloride: 97 mEq/L (ref 96–112)
GFR calc Af Amer: >90 mL/min (ref >90)
GFR, EST NON AFRICAN AMERICAN: 83 mL/min — AB (ref >90)
GLUCOSE: 90 mg/dL (ref 70–99)
Potassium: 4.6 mEq/L (ref 3.7–5.3)
SODIUM: 137 meq/L (ref 137–147)
Total Bilirubin: 0.4 mg/dL (ref 0.3–1.2)
Total Protein: 7 g/dL (ref 6.0–8.3)

## 2014-01-31 LAB — PRO B NATRIURETIC PEPTIDE: Pro B Natriuretic peptide (BNP): 301.3 pg/mL (ref 0–450)

## 2014-01-31 MED ORDER — VALSARTAN 40 MG PO TABS: 40.0000 mg | ORAL_TABLET | Freq: Every day | ORAL | Status: DC

## 2014-01-31 MED ORDER — MORPHINE SULFATE 4 MG/ML IJ SOLN: 2.0000 mg | Freq: Once | INTRAMUSCULAR | Status: DC

## 2014-01-31 NOTE — ED Provider Notes (Signed)
CSN: [id number]     Arrival date & time 01/31/14  2003 History   First MD Initiated Contact with Patient 01/31/14 2041     Chief Complaint  Patient presents with  . Hypertension  . Arm Pain  . Headache     (Consider location/radiation/quality/duration/timing/severity/associated sxs/prior Treatment) HPI Patient presents with concern headache, bilateral arm pain, generalized discomfort. Symptoms began earlier today without clear precipitant.  Since onset she said persistent discomfort throughout, with no focal chest pain or abdominal pain.  No vomiting, no diarrhea, there is mild nausea. Patient has no fevers, chills, cough. Patient states that throughout the day she has taken her blood pressure several times and found it to be elevated consistently. Patient has multiple medical issues, including atrial fibrillation, hypertension.  Past Medical History  Diagnosis Date  . Osteoporosis   . A-fib   . Atrophic vaginitis   . Chronic constipation 2009  . 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 Rash and nonspecific skin eruption     of arms and legs intermittantly. Relieved w anti-itching cream and lotions   Past Surgical History  Procedure Laterality Date  . Coccyx fracture surgery  09/2003   Family History  Problem Relation Age of Onset  . Liver cancer Mother    History  Substance Use Topics  . Smoking status: Never Smoker   . Smokeless tobacco: Never Used  . Alcohol Use: No   OB History   Grav Para Term Preterm Abortions TAB SAB Ect Mult Living   1 0 0  1     0     Review of Systems  Constitutional:       Per HPI, otherwise negative  HENT:       Per HPI, otherwise negative  Respiratory:       Per HPI, otherwise negative  Cardiovascular:       Per HPI, otherwise negative  Gastrointestinal: Negative for vomiting.  Endocrine:       Negative aside from HPI  Genitourinary:       Neg aside from HPI   Musculoskeletal:       Per HPI, otherwise negative  Skin: Negative.     Neurological: Positive for weakness and headaches. Negative for syncope.      Allergies  Other; Shellfish allergy; and Iohexol  Home Medications   Prior to Admission medications   Medication Sig Start Date End Date Taking? Authorizing Provider  aspirin EC 81 MG tablet Take 81 mg by mouth every evening.   Yes Historical Provider, MD  Bioflavonoid Products (ESTER C PO) Take 1 tablet by mouth every evening.    Yes Historical Provider, MD  BIOTIN PO Take 5,000 mcg by mouth every evening.    Yes Historical Provider, MD  Cholecalciferol (VITAMIN D) 2000 UNITS CAPS Take 2,000 Units by mouth every evening.    Yes Historical Provider, MD  docusate sodium (COLACE) 100 MG capsule Take 100 mg by mouth 2 (two) times daily.   Yes Historical Provider, MD  EVENING PRIMROSE OIL PO Take 1,300 mg by mouth every evening.    Yes Historical Provider, MD  glucosamine-chondroitin 500-400 MG tablet Take 1-2 tablets by mouth 2 (two) times daily. 1 in the morning and 2 at night.   Yes Historical Provider, MD  hydrochlorothiazide (MICROZIDE) 12.5 MG capsule Take 12.5 mg by mouth daily.  07/30/13  Yes Historical Provider, MD  Multiple Minerals-Vitamins (CAL MAG ZINC +D3 PO) Take 2 tablets by mouth every evening.  Yes Historical Provider, MD  Multiple Vitamins-Minerals (MULTIVITAMIN PO) Take 1 tablet by mouth every evening.    Yes Historical Provider, MD  Omega-3 Fatty Acids (OMEGA 3 PO) Take 1,000 mg by mouth every evening.    Yes Historical Provider, MD  vitamin C (ASCORBIC ACID) 500 MG tablet Take 500 mg by mouth every evening.   Yes Historical Provider, MD   BP 199/96  Pulse 71  Temp(Src) 98.2 F (36.8 C)  Resp 18  SpO2 99%  LMP 09/19/1995 Physical Exam  Nursing note and vitals reviewed. Constitutional: She is oriented to person, place, and time. She appears well-developed and well-nourished. No distress.  HENT:  Head: Normocephalic and atraumatic.  Eyes: Conjunctivae and EOM are normal.   Cardiovascular: Normal rate and regular rhythm.   Pulmonary/Chest: Effort normal and breath sounds normal. No stridor. No respiratory distress.  Abdominal: She exhibits no distension.  Musculoskeletal: She exhibits no edema.  Neurological: She is alert and oriented to person, place, and time. She displays no atrophy and no tremor. No cranial nerve deficit or sensory deficit. She exhibits normal muscle tone. She displays no seizure activity. Coordination normal.  Skin: Skin is warm and dry.     Psychiatric: She has a normal mood and affect.    ED Course  Procedures (including critical care time) Labs Review Labs Reviewed  COMPREHENSIVE METABOLIC PANEL - Abnormal; Notable for the following:    Alkaline Phosphatase 37 (*)    GFR calc non Af Amer 83 (*)    All other components within normal limits  URINALYSIS, ROUTINE W REFLEX MICROSCOPIC - Abnormal; Notable for the following:    Specific Gravity, Urine 1.004 (*)    All other components within normal limits  CBC WITH DIFFERENTIAL  PRO B NATRIURETIC PEPTIDE    Imaging Review Dg Chest 2 View  01/31/2014   CLINICAL DATA:  Chest pain and shortness of breath.  Hypertension.  EXAM: CHEST  2 VIEW  COMPARISON:  06/03/2010 and chest CT dated 02/19/2009.  FINDINGS: The heart remains normal in size. The lungs remain hyperexpanded. Mild thoracic spine degenerative changes.  IMPRESSION: No acute abnormality.  Stable changes of COPD.   Electronically Signed   By: Enrique Sack M.D.   On: 01/31/2014 22:17   Ct Head Wo Contrast  01/31/2014   CLINICAL DATA:  Altered mental state  EXAM: CT HEAD WITHOUT CONTRAST  TECHNIQUE: Contiguous axial images were obtained from the base of the skull through the vertex without intravenous contrast.  COMPARISON:  CT HEAD W/O CM dated 11/02/2009  FINDINGS: The ventricles are normal in size and position. There is no intracranial hemorrhage nor intracranial mass effect. There is no evidence of an evolving ischemic infarction.  The cerebellum and brainstem are normal in density. There are no abnormal intracranial calcifications.  At bone window settings the observed portions of the paranasal sinuses and mastoid air cells are clear. There is no evidence of an acute skull fracture.  IMPRESSION: There is no acute intracranial abnormality.   Electronically Signed   By: David  Martinique   On: 01/31/2014 22:21     EKG Interpretation   Date/Time:  Saturday Jan 31 2014 20:51:04 EDT Ventricular Rate:  66 PR Interval:  185 QRS Duration: 102 QT Interval:  396 QTC Calculation: 415 R Axis:   49 Text Interpretation:  Sinus rhythm Anteroseptal infarct, age indeterminate  Sinus rhythm No significant change since last tracing Q waves anteriorly  Abnormal ekg Confirmed by Carmin Muskrat  MD [phone])  on 01/31/2014 9:29:13  PM     Cardiac monitor 65 sinus rhythm normal Pulse oximetry 100% room air normal  Update:  Patient appears calm, blood pressure has reduced, 160/70. Patient defers offer for pain medication.   Update: Patient remains calm.  She, her husband and I had a lengthy conversation about all results thus far. Given reassuring labs, x-ray, CT scan, patient will followup with primary care, as previously scheduled in one week. Patient will be restarted on a previously taken antihypertensive.  MDM  This patient presents with generalized complaints and concern about hypertension. Patient is notably hypertensive here, though her BP improved by ~15% here and she remained hemodynamically stable, with no neurologic deficits throughout her emergency department monitoring course per Patient no evidence of dysrhythmia, though she does note a history of prior A. fib. No distress, the aforementioned reassuring findings, she was discharged to follow up with her primary care physician after additional blood pressure medication was re-started.   Carmin Muskrat, MD 01/31/14 [phone]

## 2014-01-31 NOTE — ED Notes (Signed)
Patient transported to CT 

## 2014-01-31 NOTE — ED Notes (Signed)
Pt reports that she has been having a HA, bilateral arm pain and hypertension today. Pt states that she has been "feeling bad" so she took her BP multiple times today and noted that it was elevated. Pt states she has a hx of A fib and was seen recently for this as well. Pt a&o x4, skin warm and dry, ambulatory to triage.

## 2014-01-31 NOTE — Discharge Instructions (Signed)
As discussed, your evaluation today has been largely reassuring.  But, it is important that you monitor your condition carefully, and do not hesitate to return to the ED if you develop new, or concerning changes in your condition.  Otherwise, please follow-up with your physician for appropriate ongoing care.   Remember, please take your blood pressure once daily for the next week, and maintain a log of these values.

## 2014-01-31 NOTE — ED Notes (Signed)
Per MD, order for pain medication to be discontinued and patient to be DC home

## 2014-01-31 NOTE — ED Notes (Signed)
Order for IV medication noted Per MD, hold off on starting PIV to give medications--MD would like to speak with patient

## 2014-02-04 DIAGNOSIS — R350 Frequency of micturition: Secondary | ICD-10-CM | POA: Diagnosis not present

## 2014-02-04 DIAGNOSIS — M899 Disorder of bone, unspecified: Secondary | ICD-10-CM | POA: Diagnosis not present

## 2014-02-04 DIAGNOSIS — M949 Disorder of cartilage, unspecified: Secondary | ICD-10-CM | POA: Diagnosis not present

## 2014-02-04 DIAGNOSIS — Z79899 Other long term (current) drug therapy: Secondary | ICD-10-CM | POA: Diagnosis not present

## 2014-02-11 DIAGNOSIS — M1990 Unspecified osteoarthritis, unspecified site: Secondary | ICD-10-CM | POA: Diagnosis not present

## 2014-02-11 DIAGNOSIS — F411 Generalized anxiety disorder: Secondary | ICD-10-CM | POA: Diagnosis not present

## 2014-02-11 DIAGNOSIS — Z0000 Encounter for general adult medical examination without abnormal findings: Secondary | ICD-10-CM | POA: Diagnosis not present

## 2014-02-11 DIAGNOSIS — K219 Gastro-esophageal reflux disease without esophagitis: Secondary | ICD-10-CM | POA: Diagnosis not present

## 2014-02-11 DIAGNOSIS — Z23 Encounter for immunization: Secondary | ICD-10-CM | POA: Diagnosis not present

## 2014-02-11 DIAGNOSIS — M949 Disorder of cartilage, unspecified: Secondary | ICD-10-CM | POA: Diagnosis not present

## 2014-02-11 DIAGNOSIS — M899 Disorder of bone, unspecified: Secondary | ICD-10-CM | POA: Diagnosis not present

## 2014-02-11 DIAGNOSIS — F321 Major depressive disorder, single episode, moderate: Secondary | ICD-10-CM | POA: Diagnosis not present

## 2014-02-11 DIAGNOSIS — K589 Irritable bowel syndrome without diarrhea: Secondary | ICD-10-CM | POA: Diagnosis not present

## 2014-02-16 DIAGNOSIS — Z1212 Encounter for screening for malignant neoplasm of rectum: Secondary | ICD-10-CM | POA: Diagnosis not present

## 2014-03-10 DIAGNOSIS — H43819 Vitreous degeneration, unspecified eye: Secondary | ICD-10-CM | POA: Diagnosis not present

## 2014-03-10 DIAGNOSIS — H2510 Age-related nuclear cataract, unspecified eye: Secondary | ICD-10-CM | POA: Diagnosis not present

## 2014-03-10 DIAGNOSIS — H04129 Dry eye syndrome of unspecified lacrimal gland: Secondary | ICD-10-CM | POA: Diagnosis not present

## 2014-04-27 DIAGNOSIS — I10 Essential (primary) hypertension: Secondary | ICD-10-CM | POA: Diagnosis not present

## 2014-04-27 DIAGNOSIS — R002 Palpitations: Secondary | ICD-10-CM | POA: Diagnosis not present

## 2014-05-14 DIAGNOSIS — L570 Actinic keratosis: Secondary | ICD-10-CM | POA: Diagnosis not present

## 2014-05-14 DIAGNOSIS — M7130 Other bursal cyst, unspecified site: Secondary | ICD-10-CM | POA: Diagnosis not present

## 2014-05-14 DIAGNOSIS — Z85828 Personal history of other malignant neoplasm of skin: Secondary | ICD-10-CM | POA: Diagnosis not present

## 2014-05-14 DIAGNOSIS — L819 Disorder of pigmentation, unspecified: Secondary | ICD-10-CM | POA: Diagnosis not present

## 2014-05-14 DIAGNOSIS — L821 Other seborrheic keratosis: Secondary | ICD-10-CM | POA: Diagnosis not present

## 2014-05-14 DIAGNOSIS — K130 Diseases of lips: Secondary | ICD-10-CM | POA: Diagnosis not present

## 2014-05-14 DIAGNOSIS — H61009 Unspecified perichondritis of external ear, unspecified ear: Secondary | ICD-10-CM | POA: Diagnosis not present

## 2014-05-14 DIAGNOSIS — D1801 Hemangioma of skin and subcutaneous tissue: Secondary | ICD-10-CM | POA: Diagnosis not present

## 2014-07-20 ENCOUNTER — Encounter (HOSPITAL_COMMUNITY): Payer: Self-pay | Admitting: Emergency Medicine

## 2014-07-29 DIAGNOSIS — I10 Essential (primary) hypertension: Secondary | ICD-10-CM | POA: Diagnosis not present

## 2014-08-10 DIAGNOSIS — Z79899 Other long term (current) drug therapy: Secondary | ICD-10-CM | POA: Diagnosis not present

## 2014-08-10 DIAGNOSIS — M1990 Unspecified osteoarthritis, unspecified site: Secondary | ICD-10-CM | POA: Diagnosis not present

## 2014-08-10 DIAGNOSIS — F321 Major depressive disorder, single episode, moderate: Secondary | ICD-10-CM | POA: Diagnosis not present

## 2014-08-10 DIAGNOSIS — G500 Trigeminal neuralgia: Secondary | ICD-10-CM | POA: Diagnosis not present

## 2014-08-10 DIAGNOSIS — K589 Irritable bowel syndrome without diarrhea: Secondary | ICD-10-CM | POA: Diagnosis not present

## 2014-08-10 DIAGNOSIS — Z6820 Body mass index (BMI) 20.0-20.9, adult: Secondary | ICD-10-CM | POA: Diagnosis not present

## 2014-08-10 DIAGNOSIS — M859 Disorder of bone density and structure, unspecified: Secondary | ICD-10-CM | POA: Diagnosis not present

## 2014-08-10 DIAGNOSIS — Z23 Encounter for immunization: Secondary | ICD-10-CM | POA: Diagnosis not present

## 2014-08-10 DIAGNOSIS — F418 Other specified anxiety disorders: Secondary | ICD-10-CM | POA: Diagnosis not present

## 2014-08-18 DIAGNOSIS — B029 Zoster without complications: Secondary | ICD-10-CM

## 2014-08-18 HISTORY — DX: Zoster without complications: B02.9

## 2014-08-26 ENCOUNTER — Encounter (HOSPITAL_COMMUNITY): Payer: Self-pay

## 2014-08-26 ENCOUNTER — Other Ambulatory Visit (HOSPITAL_COMMUNITY): Payer: Self-pay | Admitting: Internal Medicine

## 2014-08-26 ENCOUNTER — Ambulatory Visit (HOSPITAL_COMMUNITY)
Admission: RE | Admit: 2014-08-26 | Discharge: 2014-08-26 | Disposition: A | Discharge status: Home / Self Care | Payer: Medicare Other | Source: Ambulatory Visit | Attending: Internal Medicine | Admitting: Internal Medicine

## 2014-08-26 DIAGNOSIS — M810 Age-related osteoporosis without current pathological fracture: Secondary | ICD-10-CM | POA: Diagnosis not present

## 2014-08-26 MED ORDER — ZOLEDRONIC ACID 5 MG/100ML IV SOLN
5.0000 mg | Freq: Once | INTRAVENOUS | Status: AC
  Administered 2014-08-26: 5 mg via INTRAVENOUS
  Filled 2014-08-26: qty 100

## 2014-08-26 MED ORDER — SODIUM CHLORIDE 0.9 % IV SOLN
Freq: Once | INTRAVENOUS | Status: AC
  Administered 2014-08-26: 14:00:00 via INTRAVENOUS

## 2014-08-26 NOTE — Discharge Instructions (Signed)

## 2014-09-04 ENCOUNTER — Other Ambulatory Visit: Payer: Self-pay | Admitting: Obstetrics & Gynecology

## 2014-09-04 ENCOUNTER — Other Ambulatory Visit: Payer: Self-pay

## 2014-09-04 ENCOUNTER — Ambulatory Visit (INDEPENDENT_AMBULATORY_CARE_PROVIDER_SITE_OTHER): Payer: Medicare Other | Admitting: Obstetrics & Gynecology

## 2014-09-04 ENCOUNTER — Encounter: Payer: Self-pay | Admitting: Obstetrics & Gynecology

## 2014-09-04 VITALS — BP 132/70 | HR 60 | Resp 16 | Ht 66.0 in | Wt 125.0 lb

## 2014-09-04 DIAGNOSIS — B029 Zoster without complications: Secondary | ICD-10-CM | POA: Diagnosis not present

## 2014-09-04 DIAGNOSIS — Z1231 Encounter for screening mammogram for malignant neoplasm of breast: Secondary | ICD-10-CM

## 2014-09-04 DIAGNOSIS — Z01419 Encounter for gynecological examination (general) (routine) without abnormal findings: Secondary | ICD-10-CM

## 2014-09-04 DIAGNOSIS — Z124 Encounter for screening for malignant neoplasm of cervix: Secondary | ICD-10-CM

## 2014-09-04 DIAGNOSIS — Z85828 Personal history of other malignant neoplasm of skin: Secondary | ICD-10-CM | POA: Diagnosis not present

## 2014-09-04 NOTE — Progress Notes (Signed)
73 y.o. G1P0010 MarriedCaucasianF here for annual exam.  Has had two episodes of atrial fibrillation that took her to the ER earlier this year.  Recently treated for URI.  Feeling much better.  No vaginal bleeding.    Pt has follow up with Dr. Osborne Casco in three months due to recent weight loss which she feels is related to the URI.  Pt reports blood work was good when saw him earlier this fall.   Patient's last menstrual period was 09/19/1995.          Sexually active: No.  The current method of family planning is post menopausal status.    Exercising: No.  not regularly Smoker:  no  Health Maintenance: Pap:  11/14 WNL History of abnormal Pap:  no MMG:  04/08/12-normal Colonoscopy:  5/09-repeat in 10 years-Dr Olevia Perches BMD:   2015-slight deterioration of hip, just had #3 reclast infusion TDaP:  10/13 Screening Labs: PCP, Hb today: PCP, Urine today: PCP   reports that she has never smoked. She has never used smokeless tobacco. She reports that she drinks alcohol. She reports that she does not use illicit drugs.  Past Medical History  Diagnosis Date  . Osteoporosis   . A-fib   . Atrophic vaginitis   . Chronic constipation 2009  . 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 Rash and nonspecific skin eruption     of arms and legs intermittantly. Relieved w anti-itching cream and lotions  . Shingles 12/15    mild case  . Sight deterioration 2015    of hip    Past Surgical History  Procedure Laterality Date  . Coccyx fracture surgery  09/2003    Current Outpatient Prescriptions  Medication Sig Dispense Refill  . aspirin EC 81 MG tablet Take 81 mg by mouth every evening.    Marland Kitchen Bioflavonoid Products (ESTER C PO) Take 1 tablet by mouth every evening.     Marland Kitchen BIOTIN PO Take 5,000 mcg by mouth every evening.     . Cholecalciferol (VITAMIN D) 2000 UNITS CAPS Take 2,000 Units by mouth every evening.     . docusate sodium (COLACE) 100 MG capsule Take 100 mg by mouth 2 (two) times daily.    Marland Kitchen EVENING  PRIMROSE OIL PO Take 1,300 mg by mouth every evening.     Marland Kitchen glucosamine-chondroitin 500-400 MG tablet Take 3 tablets by mouth daily.     . Multiple Vitamins-Minerals (MULTIVITAMIN PO) Take 1 tablet by mouth every evening.     . Omega-3 Fatty Acids (OMEGA 3 PO) Take 1,000 mg by mouth every evening.     . valsartan (DIOVAN) 40 MG tablet Take 1 tablet (40 mg total) by mouth daily. 30 tablet 0  . vitamin C (ASCORBIC ACID) 500 MG tablet Take 500 mg by mouth every evening.     No current facility-administered medications for this visit.    Family History  Problem Relation Age of Onset  . Liver cancer Mother     ROS:  Pertinent items are noted in HPI.  Otherwise, a comprehensive ROS was negative.  Exam:   BP 132/70 mmHg  Pulse 60  Resp 16  Ht 5\' 6"  (1.676 m)  Wt 125 lb (56.7 kg)  BMI 20.19 kg/m2  LMP 09/19/1995  Weight change: -6#   Height: 5\' 6"  (167.6 cm)  Ht Readings from Last 3 Encounters:  09/04/14 5\' 6"  (1.676 m)  08/26/14 5\' 6"  (1.676 m)  10/21/13 5\' 6"  (1.676 m)  General appearance: alert, cooperative and appears stated age Head: Normocephalic, without obvious abnormality, atraumatic Neck: no adenopathy, supple, symmetrical, trachea midline and thyroid normal to inspection and palpation Lungs: clear to auscultation bilaterally Breasts: normal appearance, no masses or tenderness Heart: regular rate and rhythm Abdomen: soft, non-tender; bowel sounds normal; no masses,  no organomegaly Extremities: extremities normal, atraumatic, no cyanosis or edema Skin: Skin color, texture, turgor normal. No rashes or lesions Lymph nodes: Cervical, supraclavicular, and axillary nodes normal. No abnormal inguinal nodes palpated Neurologic: Grossly normal   Pelvic: External genitalia:  no lesions              Urethra:  normal appearing urethra with no masses, tenderness or lesions              Bartholins and Skenes: normal                 Vagina: normal appearing vagina with normal  color and discharge, no lesions              Cervix: no lesions              Pap taken: No Bimanual Exam:  Uterus:  normal size, contour, position, consistency, mobility, non-tender              Adnexa: normal adnexa and no mass, fullness, tenderness               Rectovaginal: Confirms               Anus:  normal sphincter tone, no lesions  Chaperone was present for exam.  A:  Well Woman with normal exam PMP, no HRT Vaginal atrophic changes  Osteopenia, on reclast (Just had infusion in early December) Afib  P: Mammogram due scheduled for pt today. pap smear last year.  No pap today. Labs/follow up with Dr. Osborne Casco return annually or prn

## 2014-09-04 NOTE — Addendum Note (Signed)
Addended by: Megan Salon on: 09/04/2014 01:44 PM   Modules accepted: Miquel Dunn

## 2014-09-07 ENCOUNTER — Ambulatory Visit: Payer: Medicare Other

## 2014-09-18 DIAGNOSIS — S322XXA Fracture of coccyx, initial encounter for closed fracture: Secondary | ICD-10-CM

## 2014-09-18 HISTORY — DX: Fracture of coccyx, initial encounter for closed fracture: S32.2XXA

## 2014-09-22 ENCOUNTER — Ambulatory Visit
Admission: RE | Admit: 2014-09-22 | Discharge: 2014-09-22 | Disposition: A | Discharge status: Home / Self Care | Payer: Medicare Other | Source: Ambulatory Visit | Attending: Obstetrics & Gynecology | Admitting: Obstetrics & Gynecology

## 2014-09-22 DIAGNOSIS — Z1231 Encounter for screening mammogram for malignant neoplasm of breast: Secondary | ICD-10-CM | POA: Diagnosis not present

## 2014-10-01 DIAGNOSIS — J209 Acute bronchitis, unspecified: Secondary | ICD-10-CM | POA: Diagnosis not present

## 2014-10-01 DIAGNOSIS — Z681 Body mass index (BMI) 19 or less, adult: Secondary | ICD-10-CM | POA: Diagnosis not present

## 2014-10-11 DIAGNOSIS — I499 Cardiac arrhythmia, unspecified: Secondary | ICD-10-CM | POA: Diagnosis not present

## 2014-10-11 DIAGNOSIS — R918 Other nonspecific abnormal finding of lung field: Secondary | ICD-10-CM | POA: Diagnosis not present

## 2014-10-11 DIAGNOSIS — J189 Pneumonia, unspecified organism: Secondary | ICD-10-CM | POA: Diagnosis not present

## 2014-10-11 DIAGNOSIS — R531 Weakness: Secondary | ICD-10-CM | POA: Diagnosis not present

## 2014-10-11 DIAGNOSIS — R002 Palpitations: Secondary | ICD-10-CM | POA: Diagnosis not present

## 2014-10-11 DIAGNOSIS — I482 Chronic atrial fibrillation: Secondary | ICD-10-CM | POA: Diagnosis not present

## 2014-10-15 DIAGNOSIS — M6281 Muscle weakness (generalized): Secondary | ICD-10-CM | POA: Diagnosis not present

## 2014-10-20 DIAGNOSIS — F418 Other specified anxiety disorders: Secondary | ICD-10-CM | POA: Diagnosis not present

## 2014-10-20 DIAGNOSIS — Z681 Body mass index (BMI) 19 or less, adult: Secondary | ICD-10-CM | POA: Diagnosis not present

## 2014-10-20 DIAGNOSIS — M542 Cervicalgia: Secondary | ICD-10-CM | POA: Diagnosis not present

## 2014-10-20 DIAGNOSIS — J209 Acute bronchitis, unspecified: Secondary | ICD-10-CM | POA: Diagnosis not present

## 2014-12-24 ENCOUNTER — Other Ambulatory Visit: Payer: Self-pay | Admitting: Dermatology

## 2014-12-24 DIAGNOSIS — Z85828 Personal history of other malignant neoplasm of skin: Secondary | ICD-10-CM | POA: Diagnosis not present

## 2014-12-24 DIAGNOSIS — L851 Acquired keratosis [keratoderma] palmaris et plantaris: Secondary | ICD-10-CM | POA: Diagnosis not present

## 2014-12-24 DIAGNOSIS — D485 Neoplasm of uncertain behavior of skin: Secondary | ICD-10-CM | POA: Diagnosis not present

## 2015-02-09 DIAGNOSIS — K219 Gastro-esophageal reflux disease without esophagitis: Secondary | ICD-10-CM | POA: Diagnosis not present

## 2015-02-09 DIAGNOSIS — M859 Disorder of bone density and structure, unspecified: Secondary | ICD-10-CM | POA: Diagnosis not present

## 2015-02-09 DIAGNOSIS — Z79899 Other long term (current) drug therapy: Secondary | ICD-10-CM | POA: Diagnosis not present

## 2015-02-16 DIAGNOSIS — Z1212 Encounter for screening for malignant neoplasm of rectum: Secondary | ICD-10-CM | POA: Diagnosis not present

## 2015-02-16 DIAGNOSIS — K219 Gastro-esophageal reflux disease without esophagitis: Secondary | ICD-10-CM | POA: Diagnosis not present

## 2015-02-16 DIAGNOSIS — Z0000 Encounter for general adult medical examination without abnormal findings: Secondary | ICD-10-CM | POA: Diagnosis not present

## 2015-02-16 DIAGNOSIS — M546 Pain in thoracic spine: Secondary | ICD-10-CM | POA: Diagnosis not present

## 2015-02-16 DIAGNOSIS — F418 Other specified anxiety disorders: Secondary | ICD-10-CM | POA: Diagnosis not present

## 2015-02-16 DIAGNOSIS — R350 Frequency of micturition: Secondary | ICD-10-CM | POA: Diagnosis not present

## 2015-02-16 DIAGNOSIS — K589 Irritable bowel syndrome without diarrhea: Secondary | ICD-10-CM | POA: Diagnosis not present

## 2015-02-16 DIAGNOSIS — R490 Dysphonia: Secondary | ICD-10-CM | POA: Diagnosis not present

## 2015-02-16 DIAGNOSIS — Z681 Body mass index (BMI) 19 or less, adult: Secondary | ICD-10-CM | POA: Diagnosis not present

## 2015-02-16 DIAGNOSIS — M859 Disorder of bone density and structure, unspecified: Secondary | ICD-10-CM | POA: Diagnosis not present

## 2015-02-16 DIAGNOSIS — G500 Trigeminal neuralgia: Secondary | ICD-10-CM | POA: Diagnosis not present

## 2015-02-16 DIAGNOSIS — M1990 Unspecified osteoarthritis, unspecified site: Secondary | ICD-10-CM | POA: Diagnosis not present

## 2015-02-16 DIAGNOSIS — F321 Major depressive disorder, single episode, moderate: Secondary | ICD-10-CM | POA: Diagnosis not present

## 2015-04-14 DIAGNOSIS — M70972 Unspecified soft tissue disorder related to use, overuse and pressure, left ankle and foot: Secondary | ICD-10-CM | POA: Diagnosis not present

## 2015-04-14 DIAGNOSIS — Z6820 Body mass index (BMI) 20.0-20.9, adult: Secondary | ICD-10-CM | POA: Diagnosis not present

## 2015-04-14 DIAGNOSIS — M79672 Pain in left foot: Secondary | ICD-10-CM | POA: Diagnosis not present

## 2015-06-01 DIAGNOSIS — H2513 Age-related nuclear cataract, bilateral: Secondary | ICD-10-CM | POA: Diagnosis not present

## 2015-07-15 ENCOUNTER — Encounter (HOSPITAL_COMMUNITY): Payer: Self-pay

## 2015-07-15 ENCOUNTER — Ambulatory Visit (INDEPENDENT_AMBULATORY_CARE_PROVIDER_SITE_OTHER): Payer: Medicare Other | Admitting: Gastroenterology

## 2015-07-15 ENCOUNTER — Encounter: Payer: Self-pay | Admitting: Internal Medicine

## 2015-07-15 ENCOUNTER — Encounter: Payer: Self-pay | Admitting: Gastroenterology

## 2015-07-15 ENCOUNTER — Ambulatory Visit (HOSPITAL_COMMUNITY): Admit: 2015-07-15 | Payer: Self-pay

## 2015-07-15 VITALS — BP 136/80 | HR 76 | Ht 66.0 in | Wt 127.0 lb

## 2015-07-15 DIAGNOSIS — K5902 Outlet dysfunction constipation: Secondary | ICD-10-CM

## 2015-07-15 DIAGNOSIS — K625 Hemorrhage of anus and rectum: Secondary | ICD-10-CM | POA: Diagnosis not present

## 2015-07-15 DIAGNOSIS — R194 Change in bowel habit: Secondary | ICD-10-CM

## 2015-07-15 SURGERY — MANOMETRY, ANORECTAL

## 2015-07-15 MED ORDER — POLYETHYLENE GLYCOL 3350 17 GM/SCOOP PO POWD: 17.0000 g | Freq: Every day | ORAL | Status: DC

## 2015-07-15 MED ORDER — NA SULFATE-K SULFATE-MG SULF 17.5-3.13-1.6 GM/177ML PO SOLN: 1.0000 | Freq: Once | ORAL | Status: DC

## 2015-07-15 NOTE — Patient Instructions (Signed)

## 2015-07-15 NOTE — Progress Notes (Signed)
April Decker    [id number]    1941/04/05  [facility] 72 [address], MD  Referring Physician: Drenda Freeze, MD [address] [place], [place] [address]  Chief complaint:  Constipation, change in bowel pattern, brbpr HPI: 74 year old Caucasian white female here with complaints of change in bowel pattern, worsening in the past few months. She is having worsening constipation, feels there is something obstructing with her having bowel movements, and also thinks her stool is coming out thinner. She has been taking OTC stool softeners, with no significant improvment. She also feels hemorrhoids getting worse, has had intermittent episodes of bright red blood per rectum for the past few months. No nausea or vomiting or abdominal pain. On review of system positive for bloating. No weight loss or gain  Outpatient Encounter Prescriptions as of 07/15/2015  Medication Sig  . aspirin EC 81 MG tablet Take 81 mg by mouth every evening.  Marland Kitchen Bioflavonoid Products (ESTER C PO) Take 1 tablet by mouth every evening.   Marland Kitchen BIOTIN PO Take 5,000 mcg by mouth every evening.   Marland Kitchen CALCIUM-MAGNESIUM PO Take by mouth.  . Cholecalciferol (VITAMIN D) 2000 UNITS CAPS Take 2,000 Units by mouth every evening.   . docusate sodium (COLACE) 100 MG capsule Take 100 mg by mouth 2 (two) times daily.  Marland Kitchen EVENING PRIMROSE OIL PO Take 1,300 mg by mouth every evening.   Marland Kitchen glucosamine-chondroitin 500-400 MG tablet Take 3 tablets by mouth daily.   . Multiple Vitamins-Minerals (MULTIVITAMIN PO) Take 1 tablet by mouth every evening.   . Omega-3 Fatty Acids (OMEGA 3 PO) Take 1,000 mg by mouth every evening.   . valsartan (DIOVAN) 40 MG tablet Take 1 tablet (40 mg total) by mouth daily.  . vitamin C (ASCORBIC ACID) 500 MG tablet Take 500 mg by mouth every evening.   No facility-administered encounter medications on file as of 07/15/2015.    Allergies as of 07/15/2015 - Review Complete 07/15/2015  Allergen  Reaction Noted  . Other  08/07/2013  . Shellfish allergy Hives 08/07/2013  . Iohexol  11/02/2009    Past Medical History  Diagnosis Date  . Osteoporosis   . A-fib ([place])   . Atrophic vaginitis   . Chronic constipation 2009  . 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 Rash and nonspecific skin eruption     of arms and legs intermittantly. Relieved w anti-itching cream and lotions  . Shingles 12/15    mild case  . Sight deterioration 2015    of hip  . Depressed     Past Surgical History  Procedure Laterality Date  . Coccyx fracture surgery  09/2003  . Tonsillectomy      Family History  Problem Relation Age of Onset  . Liver cancer Mother     Social History   Social History  . Marital Status: Married    Spouse Name: N/A  . Number of Children: N/A  . Years of Education: N/A   Occupational History  . Not on file.   Social History Main Topics  . Smoking status: Never Smoker   . Smokeless tobacco: Never Used  . Alcohol Use: 0.0 oz/week    0 Standard drinks or equivalent per week     Comment: occ glass of wine  . Drug Use: No  . Sexual Activity: No   Other Topics Concern  . Not on file   Social History Narrative  Review of systems: Review of Systems  Constitutional: Negative for fever and chills.  HENT: Negative.   Eyes: Negative for blurred vision.  Respiratory: Negative for cough, shortness of breath and wheezing.   Cardiovascular: Negative for chest pain and palpitations.  Gastrointestinal: as per HPI Genitourinary: Negative for dysuria, urgency, frequency and hematuria.  Musculoskeletal: Negative for myalgias, back pain and joint pain.  Skin: Negative for itching and rash.  Neurological: Negative for dizziness, tremors, focal weakness, seizures and loss of consciousness.  Endo/Heme/Allergies: Negative for environmental allergies.  Psychiatric/Behavioral: Negative for depression, suicidal ideas and hallucinations.  All other systems reviewed and are  negative.   Physical Exam: Filed Vitals:   07/15/15 0832  BP: 136/80  Pulse: 76   Gen:      No acute distress HEENT:  EOMI, sclera anicteric Neck:     No masses; no thyromegaly Lungs:    Clear to auscultation bilaterally; normal respiratory effort CV:         Regular rate and rhythm; no murmurs Abd:      + bowel sounds; soft, non-tender; no palpable masses, no distension Ext:    No edema; adequate peripheral perfusion Skin:      Warm and dry; no rash Neuro: alert and oriented x 3 Psych: normal mood and affect Rectal exam: stool in rectal vault, no palpable mass, no fissure or external hemorrhoids. Good sphincter squeeze, incomplete relaxation on bear down.  Data Reviewed:  Colonoscopy 2009: Normal   Assessment and Plan/Recommendations: 74 year old Caucasian white female here with complaints of change in bowel pattern, constipation and occasional BRBPR here for evaluation Patient had a normal colonoscopy in 2009, but given change in bowel pattern with worsening constipation and intermittent bright red blood per rectum will schedule for colonoscopy. Start Miralax 17g daily, titrate dose as needed based on symptoms Patient also likely has outlet dysfunction, we'll consider scheduling for anorectal manometry for evaluation if no improvement in symptoms with MiraLAX and colonoscopy evaluation is normal Return after colonoscopy  K. Denzil Magnuson , MD [phone] Mon-Fri 8a-5p [phone] after 5p, weekends, holidays

## 2015-07-19 ENCOUNTER — Encounter: Payer: Self-pay | Admitting: Gastroenterology

## 2015-07-19 ENCOUNTER — Ambulatory Visit (AMBULATORY_SURGERY_CENTER): Payer: Medicare Other | Admitting: Gastroenterology

## 2015-07-19 VITALS — BP 142/75 | HR 68 | Temp 96.3°F | Resp 17 | Ht 66.0 in | Wt 127.0 lb

## 2015-07-19 DIAGNOSIS — K625 Hemorrhage of anus and rectum: Secondary | ICD-10-CM | POA: Diagnosis not present

## 2015-07-19 DIAGNOSIS — I4891 Unspecified atrial fibrillation: Secondary | ICD-10-CM | POA: Diagnosis not present

## 2015-07-19 DIAGNOSIS — R194 Change in bowel habit: Secondary | ICD-10-CM | POA: Diagnosis not present

## 2015-07-19 DIAGNOSIS — I10 Essential (primary) hypertension: Secondary | ICD-10-CM | POA: Diagnosis not present

## 2015-07-19 MED ORDER — SODIUM CHLORIDE 0.9 % IV SOLN: 500.0000 mL | INTRAVENOUS | Status: DC

## 2015-07-19 NOTE — Progress Notes (Signed)
To recovery, report to Myers, RN, VSS. 

## 2015-07-19 NOTE — Op Note (Signed)
[place]  [facility] [employer]. [place], [address]   COLONOSCOPY PROCEDURE REPORT  PATIENT: April Decker, April Decker  MR#: [id number] BIRTHDATE: 08-29-1941 , 66  yrs. old GENDER: female ENDOSCOPIST: Harl Bowie, MD REFERRED WU:[id number] Tisovec, M.D. PROCEDURE DATE:  07/19/2015 PROCEDURE:   Colonoscopy, diagnostic First Screening Colonoscopy - Avg.  risk and is 50 yrs.  old or older - No.  Prior Negative Screening - Now for repeat screening. Less than 10 yrs Prior Negative Screening - Now for repeat screening.  Other: See Comments  History of Adenoma - Now for follow-up colonoscopy & has been > or = to 3 yrs.  N/A  Polyps removed today? No ASA CLASS:   Class II INDICATIONS:Evaluation of unexplained GI bleeding and Colorectal Neoplasm Risk Assessment for this procedure is average risk. MEDICATIONS: Propofol 150 mg IV  DESCRIPTION OF PROCEDURE:   After the risks benefits and alternatives of the procedure were thoroughly explained, informed consent was obtained.  The digital rectal exam revealed no abnormalities of the rectum.   The LB [id number]  endoscope was introduced through the anus and advanced to the cecum, which was identified by both the appendix and ileocecal valve. No adverse events experienced.   The quality of the prep was poor.  inadequate The instrument was then slowly withdrawn as the colon was fully examined. Estimated blood loss is zero unless otherwise noted in this procedure report.    COLON FINDINGS: Poor prep; cecum filled with seeds and fiber, which could not be suctioned.  Inadequate visualization of the colonic mucosa.  Retroflexion was not performed. The time to cecum = 5.4 Withdrawal time = 4.8   The scope was withdrawn and the procedure completed. COMPLICATIONS: There were no immediate complications.  ENDOSCOPIC IMPRESSION: Poor prep; cecum filled with seeds and fiber, which could not be suctioned.  Inadequate visualization of the  colonic mucosa. Procedure was aborted  RECOMMENDATIONS: Repeat colonoscopy with 2 days prep Avoid high fiber diet for 7-10 days prior to the procedure  eSigned:  Harl Bowie, MD 07/19/2015 9:46 AM

## 2015-07-19 NOTE — Patient Instructions (Addendum)
Resume current medications. New appointments scheduled.   YOU HAD AN ENDOSCOPIC PROCEDURE TODAY AT [facility]:   Refer to the procedure report that was given to you for any specific questions about what was found during the examination.  If the procedure report does not answer your questions, please call your gastroenterologist to clarify.  If you requested that your care partner not be given the details of your procedure findings, then the procedure report has been included in a sealed envelope for you to review at your convenience later.  YOU SHOULD EXPECT: Some feelings of bloating in the abdomen. Passage of more gas than usual.  Walking can help get rid of the air that was put into your GI tract during the procedure and reduce the bloating. If you had a lower endoscopy (such as a colonoscopy or flexible sigmoidoscopy) you may notice spotting of blood in your stool or on the toilet paper. If you underwent a bowel prep for your procedure, you may not have a normal bowel movement for a few days.  Please Note:  You might notice some irritation and congestion in your nose or some drainage.  This is from the oxygen used during your procedure.  There is no need for concern and it should clear up in a day or so.  SYMPTOMS TO REPORT IMMEDIATELY:   Following lower endoscopy (colonoscopy or flexible sigmoidoscopy):  Excessive amounts of blood in the stool  Significant tenderness or worsening of abdominal pains  Swelling of the abdomen that is new, acute  Fever of 100F or higher   For urgent or emergent issues, a gastroenterologist can be reached at any hour by calling [phone].   DIET: Your first meal following the procedure should be a small meal and then it is ok to progress to your normal diet. Heavy or fried foods are harder to digest and may make you feel nauseous or bloated.  Likewise, meals heavy in dairy and vegetables can increase bloating.  Drink plenty of fluids but  you should avoid alcoholic beverages for 24 hours.  ACTIVITY:  You should plan to take it easy for the rest of today and you should NOT DRIVE or use heavy machinery until tomorrow (because of the sedation medicines used during the test).    FOLLOW UP: Our staff will call the number listed on your records the next business day following your procedure to check on you and address any questions or concerns that you may have regarding the information given to you following your procedure. If we do not reach you, we will leave a message.  However, if you are feeling well and you are not experiencing any problems, there is no need to return our call.  We will assume that you have returned to your regular daily activities without incident.  If any biopsies were taken you will be contacted by phone or by letter within the next 1-3 weeks.  Please call us at [phone] if you have not heard about the biopsies in 3 weeks.    SIGNATURES/CONFIDENTIALITY: You and/or your care partner have signed paperwork which will be entered into your electronic medical record.  These signatures attest to the fact that that the information above on your After Visit Summary has been reviewed and is understood.  Full responsibility of the confidentiality of this discharge information lies with you and/or your care-partner.

## 2015-07-20 ENCOUNTER — Telehealth: Payer: Self-pay

## 2015-07-20 ENCOUNTER — Encounter: Payer: Self-pay | Admitting: Internal Medicine

## 2015-07-20 NOTE — Telephone Encounter (Signed)
  Follow up Call-  Call back number 07/19/2015  Post procedure Call Back phone  # [phone] hm  Permission to leave phone message Yes     Patient questions:  Do you have a fever, pain , or abdominal swelling? No. Pain Score  0 *  Have you tolerated food without any problems? Yes.    Have you been able to return to your normal activities? Yes.    Do you have any questions about your discharge instructions: Diet   No. Medications  No. Follow up visit  No.  Do you have questions or concerns about your Care? No.  Actions: * If pain score is 4 or above: No action needed, pain <4.

## 2015-07-22 DIAGNOSIS — D2272 Melanocytic nevi of left lower limb, including hip: Secondary | ICD-10-CM | POA: Diagnosis not present

## 2015-07-22 DIAGNOSIS — L821 Other seborrheic keratosis: Secondary | ICD-10-CM | POA: Diagnosis not present

## 2015-07-22 DIAGNOSIS — L570 Actinic keratosis: Secondary | ICD-10-CM | POA: Diagnosis not present

## 2015-07-22 DIAGNOSIS — D1801 Hemangioma of skin and subcutaneous tissue: Secondary | ICD-10-CM | POA: Diagnosis not present

## 2015-07-22 DIAGNOSIS — L814 Other melanin hyperpigmentation: Secondary | ICD-10-CM | POA: Diagnosis not present

## 2015-07-22 DIAGNOSIS — Z85828 Personal history of other malignant neoplasm of skin: Secondary | ICD-10-CM | POA: Diagnosis not present

## 2015-08-10 DIAGNOSIS — G500 Trigeminal neuralgia: Secondary | ICD-10-CM | POA: Diagnosis not present

## 2015-08-10 DIAGNOSIS — Z23 Encounter for immunization: Secondary | ICD-10-CM | POA: Diagnosis not present

## 2015-08-10 DIAGNOSIS — F321 Major depressive disorder, single episode, moderate: Secondary | ICD-10-CM | POA: Diagnosis not present

## 2015-08-10 DIAGNOSIS — F419 Anxiety disorder, unspecified: Secondary | ICD-10-CM | POA: Diagnosis not present

## 2015-08-10 DIAGNOSIS — K589 Irritable bowel syndrome without diarrhea: Secondary | ICD-10-CM | POA: Diagnosis not present

## 2015-08-10 DIAGNOSIS — M859 Disorder of bone density and structure, unspecified: Secondary | ICD-10-CM | POA: Diagnosis not present

## 2015-08-10 DIAGNOSIS — K219 Gastro-esophageal reflux disease without esophagitis: Secondary | ICD-10-CM | POA: Diagnosis not present

## 2015-08-10 DIAGNOSIS — Z681 Body mass index (BMI) 19 or less, adult: Secondary | ICD-10-CM | POA: Diagnosis not present

## 2015-08-10 DIAGNOSIS — R6889 Other general symptoms and signs: Secondary | ICD-10-CM | POA: Diagnosis not present

## 2015-08-10 DIAGNOSIS — M1990 Unspecified osteoarthritis, unspecified site: Secondary | ICD-10-CM | POA: Diagnosis not present

## 2015-09-02 DIAGNOSIS — I10 Essential (primary) hypertension: Secondary | ICD-10-CM | POA: Diagnosis not present

## 2015-09-02 DIAGNOSIS — I4891 Unspecified atrial fibrillation: Secondary | ICD-10-CM | POA: Diagnosis not present

## 2015-09-02 DIAGNOSIS — Z8679 Personal history of other diseases of the circulatory system: Secondary | ICD-10-CM | POA: Diagnosis not present

## 2015-09-02 DIAGNOSIS — R0789 Other chest pain: Secondary | ICD-10-CM | POA: Diagnosis not present

## 2015-09-02 DIAGNOSIS — M546 Pain in thoracic spine: Secondary | ICD-10-CM | POA: Diagnosis not present

## 2015-09-02 DIAGNOSIS — R42 Dizziness and giddiness: Secondary | ICD-10-CM | POA: Diagnosis not present

## 2015-09-02 DIAGNOSIS — Z6820 Body mass index (BMI) 20.0-20.9, adult: Secondary | ICD-10-CM | POA: Diagnosis not present

## 2015-09-02 DIAGNOSIS — R9431 Abnormal electrocardiogram [ECG] [EKG]: Secondary | ICD-10-CM | POA: Diagnosis not present

## 2015-09-02 DIAGNOSIS — Z79899 Other long term (current) drug therapy: Secondary | ICD-10-CM | POA: Diagnosis not present

## 2015-09-07 ENCOUNTER — Ambulatory Visit (HOSPITAL_COMMUNITY)
Admission: RE | Admit: 2015-09-07 | Discharge: 2015-09-07 | Disposition: A | Discharge status: Home / Self Care | Payer: Medicare Other | Source: Ambulatory Visit | Attending: Internal Medicine | Admitting: Internal Medicine

## 2015-09-07 ENCOUNTER — Encounter (HOSPITAL_COMMUNITY): Payer: Self-pay

## 2015-09-07 DIAGNOSIS — M810 Age-related osteoporosis without current pathological fracture: Secondary | ICD-10-CM | POA: Diagnosis not present

## 2015-09-07 MED ORDER — SODIUM CHLORIDE 0.9 % IV SOLN
Freq: Once | INTRAVENOUS | Status: AC
  Administered 2015-09-07: 250 mL via INTRAVENOUS

## 2015-09-07 MED ORDER — ZOLEDRONIC ACID 5 MG/100ML IV SOLN
5.0000 mg | Freq: Once | INTRAVENOUS | Status: AC
  Administered 2015-09-07: 5 mg via INTRAVENOUS
  Filled 2015-09-07: qty 100

## 2015-09-07 NOTE — Discharge Instructions (Signed)
RECLAST °Zoledronic Acid injection (Paget's Disease, Osteoporosis) °What is this medicine? °ZOLEDRONIC ACID (ZOE le dron ik AS id) lowers the amount of calcium loss from bone. It is used to treat Paget's disease and osteoporosis in women. °This medicine may be used for other purposes; ask your health care provider or pharmacist if you have questions. °What should I tell my health care provider before I take this medicine? °They need to know if you have any of these conditions: °-aspirin-sensitive asthma °-cancer, especially if you are receiving medicines used to treat cancer °-dental disease or wear dentures °-infection °-kidney disease °-low levels of calcium in the blood °-past surgery on the parathyroid gland or intestines °-receiving corticosteroids like dexamethasone or prednisone °-an unusual or allergic reaction to zoledronic acid, other medicines, foods, dyes, or preservatives °-pregnant or trying to get pregnant °-breast-feeding °How should I use this medicine? °This medicine is for infusion into a vein. It is given by a health care professional in a hospital or clinic setting. °Talk to your pediatrician regarding the use of this medicine in children. This medicine is not approved for use in children. °Overdosage: If you think you have taken too much of this medicine contact a poison control center or emergency room at once. °NOTE: This medicine is only for you. Do not share this medicine with others. °What if I miss a dose? °It is important not to miss your dose. Call your doctor or health care professional if you are unable to keep an appointment. °What may interact with this medicine? °-certain antibiotics given by injection °-NSAIDs, medicines for pain and inflammation, like ibuprofen or naproxen °-some diuretics like bumetanide, furosemide °-teriparatide °This list may not describe all possible interactions. Give your health care provider a list of all the medicines, herbs, non-prescription drugs, or  dietary supplements you use. Also tell them if you smoke, drink alcohol, or use illegal drugs. Some items may interact with your medicine. °What should I watch for while using this medicine? °Visit your doctor or health care professional for regular checkups. It may be some time before you see the benefit from this medicine. Do not stop taking your medicine unless your doctor tells you to. Your doctor may order blood tests or other tests to see how you are doing. °Women should inform their doctor if they wish to become pregnant or think they might be pregnant. There is a potential for serious side effects to an unborn child. Talk to your health care professional or pharmacist for more information. °You should make sure that you get enough calcium and vitamin D while you are taking this medicine. Discuss the foods you eat and the vitamins you take with your health care professional. °Some people who take this medicine have severe bone, joint, and/or muscle pain. This medicine may also increase your risk for jaw problems or a broken thigh bone. Tell your doctor right away if you have severe pain in your jaw, bones, joints, or muscles. Tell your doctor if you have any pain that does not go away or that gets worse. °Tell your dentist and dental surgeon that you are taking this medicine. You should not have major dental surgery while on this medicine. See your dentist to have a dental exam and fix any dental problems before starting this medicine. Take good care of your teeth while on this medicine. Make sure you see your dentist for regular follow-up appointments. °What side effects may I notice from receiving this medicine? °Side effects that you   should report to your doctor or health care professional as soon as possible: °-allergic reactions like skin rash, itching or hives, swelling of the face, lips, or tongue °-anxiety, confusion, or depression °-breathing problems °-changes in vision °-eye pain °-feeling faint or  lightheaded, falls °-jaw pain, especially after dental work °-mouth sores °-muscle cramps, stiffness, or weakness °-redness, blistering, peeling or loosening of the skin, including inside the mouth °-trouble passing urine or change in the amount of urine °Side effects that usually do not require medical attention (report to your doctor or health care professional if they continue or are bothersome): °-bone, joint, or muscle pain °-constipation °-diarrhea °-fever °-hair loss °-irritation at site where injected °-loss of appetite °-nausea, vomiting °-stomach upset °-trouble sleeping °-trouble swallowing °-weak or tired °This list may not describe all possible side effects. Call your doctor for medical advice about side effects. You may report side effects to FDA at 1-800-FDA-1088. °Where should I keep my medicine? °This drug is given in a hospital or clinic and will not be stored at home. °NOTE: This sheet is a summary. It may not cover all possible information. If you have questions about this medicine, talk to your doctor, pharmacist, or health care provider. °  °© 2016, Elsevier/[employer]. (2014-01-31 14:19:57) °Osteoporosis °Osteoporosis is the thinning and loss of density in the bones. Osteoporosis makes the bones more brittle, fragile, and likely to break (fracture). Over time, osteoporosis can cause the bones to become so weak that they fracture after a simple fall. The bones most likely to fracture are the bones in the hip, wrist, and spine. °CAUSES  °The exact cause is not known. °RISK FACTORS °Anyone can develop osteoporosis. You may be at greater risk if you have a family history of the condition or have poor nutrition. You may also have a higher risk if you are:  °· Female.   °· 50 years old or older. °· A smoker. °· Not physically active.   °· White or Asian. °· Slender. °SIGNS AND SYMPTOMS  °A fracture might be the first sign of the disease, especially if it results from a fall or injury that would  not usually cause a bone to break. Other signs and symptoms include:  °· Low back and neck pain. °· Stooped posture. °· Height loss. °DIAGNOSIS  °To make a diagnosis, your health care provider may: °· Take a medical history. °· Perform a physical exam. °· Order tests, such as: °¨ A bone mineral density test. °¨ A dual-energy X-ray absorptiometry test. °TREATMENT  °The goal of osteoporosis treatment is to strengthen your bones to reduce your risk of a fracture. Treatment may involve: °· Making lifestyle changes, such as: °¨ Eating a diet rich in calcium. °¨ Doing weight-bearing and muscle-strengthening exercises. °¨ Stopping tobacco use. °¨ Limiting alcohol intake. °· Taking medicine to slow the process of bone loss or to increase bone density. °· Monitoring your levels of calcium and vitamin D. °HOME CARE INSTRUCTIONS °· Include calcium and vitamin D in your diet. Calcium is important for bone health, and vitamin D helps the body absorb calcium. °· Perform weight-bearing and muscle-strengthening exercises as directed by your health care provider. °· Do not use any tobacco products, including cigarettes, chewing tobacco, and electronic cigarettes. If you need help quitting, ask your health care provider. °· Limit your alcohol intake. °· Take medicines only as directed by your health care provider. °· Keep all follow-up visits as directed by your health care provider. This is important. °· Take   precautions at home to lower your risk of falling, such as: °¨ Keeping rooms well lit and clutter free. °¨ Installing safety rails on stairs. °¨ Using rubber mats in the bathroom and other areas that are often wet or slippery. °SEEK IMMEDIATE MEDICAL CARE IF:  °You fall or injure yourself.  °  °This information is not intended to replace advice given to you by your health care provider. Make sure you discuss any questions you have with your health care provider. °  °Document Released: 06/14/2005 Document Revised: 09/25/2014  Document Reviewed: 02/12/2014 °Elsevier Interactive Patient Education ©2016 [employer] Inc. ° ° °

## 2015-09-14 ENCOUNTER — Ambulatory Visit (AMBULATORY_SURGERY_CENTER): Payer: Self-pay | Admitting: *Deleted

## 2015-09-14 VITALS — Ht 66.0 in | Wt 127.0 lb

## 2015-09-14 DIAGNOSIS — R194 Change in bowel habit: Secondary | ICD-10-CM

## 2015-09-14 MED ORDER — NA SULFATE-K SULFATE-MG SULF 17.5-3.13-1.6 GM/177ML PO SOLN: 1.0000 | Freq: Once | ORAL | Status: DC

## 2015-09-14 NOTE — Progress Notes (Signed)
No egg or soy allergy known to patient -she states she doesn't feel well after eating soy so she avoids  No issues with past sedation with any surgeries  or procedures, no intubation problems  No diet pills per patient  No home 02 use per patient   Attempted colon 07-19-2015, poor prep per Nandigam. Repeat with 2 day prep

## 2015-09-28 ENCOUNTER — Ambulatory Visit (AMBULATORY_SURGERY_CENTER): Payer: Medicare Other | Admitting: Gastroenterology

## 2015-09-28 ENCOUNTER — Encounter: Payer: Self-pay | Admitting: Gastroenterology

## 2015-09-28 VITALS — BP 141/71 | HR 63 | Temp 96.8°F | Resp 93 | Ht 66.0 in | Wt 127.0 lb

## 2015-09-28 DIAGNOSIS — I10 Essential (primary) hypertension: Secondary | ICD-10-CM | POA: Diagnosis not present

## 2015-09-28 DIAGNOSIS — K649 Unspecified hemorrhoids: Secondary | ICD-10-CM

## 2015-09-28 DIAGNOSIS — R194 Change in bowel habit: Secondary | ICD-10-CM | POA: Diagnosis not present

## 2015-09-28 DIAGNOSIS — K921 Melena: Secondary | ICD-10-CM

## 2015-09-28 DIAGNOSIS — I4891 Unspecified atrial fibrillation: Secondary | ICD-10-CM | POA: Diagnosis not present

## 2015-09-28 MED ORDER — SODIUM CHLORIDE 0.9 % IV SOLN: 500.0000 mL | INTRAVENOUS | Status: DC

## 2015-09-28 NOTE — Patient Instructions (Signed)
YOU HAD AN ENDOSCOPIC PROCEDURE TODAY AT [facility]:   Refer to the procedure report that was given to you for any specific questions about what was found during the examination.  If the procedure report does not answer your questions, please call your gastroenterologist to clarify.  If you requested that your care partner not be given the details of your procedure findings, then the procedure report has been included in a sealed envelope for you to review at your convenience later.  YOU SHOULD EXPECT: Some feelings of bloating in the abdomen. Passage of more gas than usual.  Walking can help get rid of the air that was put into your GI tract during the procedure and reduce the bloating. If you had a lower endoscopy (such as a colonoscopy or flexible sigmoidoscopy) you may notice spotting of blood in your stool or on the toilet paper. If you underwent a bowel prep for your procedure, you may not have a normal bowel movement for a few days.  Please Note:  You might notice some irritation and congestion in your nose or some drainage.  This is from the oxygen used during your procedure.  There is no need for concern and it should clear up in a day or so.  SYMPTOMS TO REPORT IMMEDIATELY:   Following lower endoscopy (colonoscopy or flexible sigmoidoscopy):  Excessive amounts of blood in the stool  Significant tenderness or worsening of abdominal pains  Swelling of the abdomen that is new, acute  Fever of 100F or higher   For urgent or emergent issues, a gastroenterologist can be reached at any hour by calling [phone].   DIET: Your first meal following the procedure should be a small meal and then it is ok to progress to your normal diet. Heavy or fried foods are harder to digest and may make you feel nauseous or bloated.  Likewise, meals heavy in dairy and vegetables can increase bloating.  Drink plenty of fluids but you should avoid alcoholic beverages for 24  hours.  ACTIVITY:  You should plan to take it easy for the rest of today and you should NOT DRIVE or use heavy machinery until tomorrow (because of the sedation medicines used during the test).    FOLLOW UP: Our staff will call the number listed on your records the next business day following your procedure to check on you and address any questions or concerns that you may have regarding the information given to you following your procedure. If we do not reach you, we will leave a message.  However, if you are feeling well and you are not experiencing any problems, there is no need to return our call.  We will assume that you have returned to your regular daily activities without incident.  If any biopsies were taken you will be contacted by phone or by letter within the next 1-3 weeks.  Please call us at [phone] if you have not heard about the biopsies in 3 weeks.    SIGNATURES/CONFIDENTIALITY: You and/or your care partner have signed paperwork which will be entered into your electronic medical record.  These signatures attest to the fact that that the information above on your After Visit Summary has been reviewed and is understood.  Full responsibility of the confidentiality of this discharge information lies with you and/or your care-partner.  Hemorrhoid information given.  No need for return colonoscopy, see Colonoscopy Procedure Report.

## 2015-09-28 NOTE — Op Note (Signed)
[place]  [address] [place], [address]   COLONOSCOPY PROCEDURE REPORT  PATIENT: April Decker, April Decker  MR#: [id number] BIRTHDATE: 1941/06/27 , 75  yrs. old GENDER: female ENDOSCOPIST: Harl Bowie, MD REFERRED [id number] Tisovec, M.D. PROCEDURE DATE:  09/28/2015 PROCEDURE:   Colonoscopy, diagnostic First Screening Colonoscopy - Avg.  risk and is 50 yrs.  old or older - No.  Prior Negative Screening - Now for repeat screening. Less than 10 yrs Prior Negative Screening - Now for repeat screening.  Other: See Comments  History of Adenoma - Now for follow-up colonoscopy & has been > or = to 3 yrs.  N/A  Polyps removed today? No Recommend repeat exam, <10 yrs? No ASA CLASS:   Class II INDICATIONS:Evaluation of unexplained GI bleeding and Colorectal Neoplasm Risk Assessment for this procedure is average risk. MEDICATIONS: Propofol 300 mg IV  DESCRIPTION OF PROCEDURE:   After the risks benefits and alternatives of the procedure were thoroughly explained, informed consent was obtained.  The digital rectal exam revealed no abnormalities of the rectum.   The LB [id number]  endoscope was introduced through the anus and advanced to the cecum, which was identified by both the appendix and ileocecal valve. No adverse events experienced.   The quality of the prep was adequate after extensive wash and suction.  The instrument was then slowly withdrawn as the colon was fully examined. Estimated blood loss is zero unless otherwise noted in this procedure report.   COLON FINDINGS: Moderate sized internal hemorrhoids were found. The examination was otherwise normal.  Retroflexed views revealed internal hemorrhoids. The time to cecum = 13.3 Withdrawal time = 11.9   The scope was withdrawn and the procedure completed. COMPLICATIONS: There were no immediate complications.  ENDOSCOPIC IMPRESSION: 1.   Moderate sized internal hemorrhoids 2.   The examination was otherwise  normal  RECOMMENDATIONS: Given your age, you will not need another colonoscopy for colon cancer screening or polyp surveillance.  These types of tests usually stop around the age 77.  eSigned:  Harl Bowie, MD 09/28/2015 11:38 AM   cc:

## 2015-09-28 NOTE — Progress Notes (Signed)
Patient awakening,vss,report to rn 

## 2015-09-29 ENCOUNTER — Telehealth: Payer: Self-pay | Admitting: *Deleted

## 2015-09-29 NOTE — Telephone Encounter (Signed)
  Follow up Call-  Call back number 09/28/2015 07/19/2015  Post procedure Call Back phone  # [phone] hm  Permission to leave phone message Yes Yes     Patient questions:  Do you have a fever, pain , or abdominal swelling? No. Pain Score  0 *  Have you tolerated food without any problems? Yes.    Have you been able to return to your normal activities? Yes.    Do you have any questions about your discharge instructions: Diet   No. Medications  No. Follow up visit  No.  Do you have questions or concerns about your Care? No.  Actions: * If pain score is 4 or above: No action needed, pain <4.  Pt did complain of or mention that she had an episode last night where she got very cold and shivered and then later got hot,was asking if it was a reaction to anesthesia.   Encouraged pt to drink a lot today and keep herself well hydrated and to call us back if had any more problems.

## 2015-10-27 DIAGNOSIS — Z85828 Personal history of other malignant neoplasm of skin: Secondary | ICD-10-CM | POA: Diagnosis not present

## 2015-10-27 DIAGNOSIS — L438 Other lichen planus: Secondary | ICD-10-CM | POA: Diagnosis not present

## 2015-11-05 ENCOUNTER — Ambulatory Visit (INDEPENDENT_AMBULATORY_CARE_PROVIDER_SITE_OTHER): Payer: Medicare Other | Admitting: Obstetrics & Gynecology

## 2015-11-05 ENCOUNTER — Encounter: Payer: Self-pay | Admitting: Obstetrics & Gynecology

## 2015-11-05 VITALS — BP 104/60 | HR 68 | Resp 16 | Ht 65.75 in | Wt 128.0 lb

## 2015-11-05 DIAGNOSIS — Z124 Encounter for screening for malignant neoplasm of cervix: Secondary | ICD-10-CM

## 2015-11-05 DIAGNOSIS — Z01419 Encounter for gynecological examination (general) (routine) without abnormal findings: Secondary | ICD-10-CM | POA: Diagnosis not present

## 2015-11-05 NOTE — Progress Notes (Signed)
75 y.o. G1P0010 MarriedCaucasianF here for annual exam.  Patient is doing well.  Reports having some changes in her bowel functions.  Saw Dr. Silverio Decamp but had seen Dr. Olevia Perches in the past.  Pt was also having some issues with hemorrhoids.  Had a colonoscopy in 07/19/15 but didn't have adequate prep so had to had repeat colonoscopy 09/28/15.  Has been advised she does not need another colonoscopy.  Thinks she is having more gas since the colonoscopy.    Denies vaginal bleeding.  PCP:  Dr. Osborne Casco.  Did have lab work last year.  Brought results with her.  Patient's last menstrual period was 09/19/1995.          Sexually active: No.  The current method of family planning is post menopausal status.    Exercising: No.  The patient does not participate in regular exercise at present. Smoker:  no  Health Maintenance: Pap: 08/08/13 Neg History of abnormal Pap:  no MMG:  09/22/14 BIRADS1:neg Colonoscopy:  09/28/15 hemorrhoids, Dr. Silverio Decamp BMD:   2015 on reclast TDaP:  2013 Screening Labs: PCP, Hb today: PCP, Urine today: PCP   reports that she has never smoked. She has never used smokeless tobacco. She reports that she does not drink alcohol or use illicit drugs.  Past Medical History  Diagnosis Date  . Osteoporosis   . A-fib ([place])   . Atrophic vaginitis   . Chronic constipation 2009  . 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 Rash and nonspecific skin eruption     of arms and legs intermittantly. Relieved w anti-itching cream and lotions  . Shingles 12/15    mild case  . Sight deterioration 2015    of hip  . Depressed   . Allergy   . Anxiety   . Cataract     bil eyes - "beginning" per pt  . GERD (gastroesophageal reflux disease)   . Hypertension   . Hemorrhoids     Past Surgical History  Procedure Laterality Date  . Closed reduction coccygeal fracture  09/2003    no surgery  . Tonsillectomy    . Colonoscopy  2009    attempted 07-19-2015-unable to complete poor prep     Current  Outpatient Prescriptions  Medication Sig Dispense Refill  . aspirin EC 81 MG tablet Take 81 mg by mouth every evening.    Marland Kitchen BIOTIN PO Take 5,000 mcg by mouth every evening.     Marland Kitchen CALCIUM-MAGNESIUM PO Take by mouth. 400mg -200mg  +D3    . Cholecalciferol (VITAMIN D) 2000 UNITS CAPS Take 2,000 Units by mouth every evening.     . docusate sodium (COLACE) 100 MG capsule Take 100 mg by mouth daily as needed. Reported on 09/28/2015    . EVENING PRIMROSE OIL PO Take 1,300 mg by mouth every evening.     Marland Kitchen glucosamine-chondroitin 500-400 MG tablet Take 3 tablets by mouth daily.     . Multiple Vitamins-Minerals (MULTIVITAMIN PO) Take 1 tablet by mouth every evening.     . Omega-3 Fatty Acids (OMEGA 3 PO) Take 1,000 mg by mouth every evening.     . valsartan (DIOVAN) 40 MG tablet Take 1 tablet (40 mg total) by mouth daily. 30 tablet 0  . vitamin C (ASCORBIC ACID) 500 MG tablet Take 500 mg by mouth every evening.    . zoledronic acid (RECLAST) 5 MG/100ML SOLN injection Inject 5 mg into the vein once.     No current facility-administered medications for this visit.  Family History  Problem Relation Age of Onset  . Liver cancer Mother   . Colon cancer Neg Hx   . Esophageal cancer Neg Hx   . Stomach cancer Neg Hx   . Rectal cancer Neg Hx   . Colon polyps Neg Hx     ROS:  Pertinent items are noted in HPI.  Otherwise, a comprehensive ROS was negative.  Exam:   BP 104/60 mmHg  Pulse 68  Resp 16  Ht 5' 5.75" (1.67 m)  Wt 128 lb (58.06 kg)  BMI 20.82 kg/m2  LMP 09/19/1995  Weight change: -3#  Height: 5' 5.75" (167 cm)  Ht Readings from Last 3 Encounters:  11/05/15 5' 5.75" (1.67 m)  09/28/15 5\' 6"  (1.676 m)  09/14/15 5\' 6"  (1.676 m)    General appearance: alert, cooperative and appears stated age Head: Normocephalic, without obvious abnormality, atraumatic Neck: no adenopathy, supple, symmetrical, trachea midline and thyroid normal to inspection and palpation Lungs: clear to auscultation  bilaterally Breasts: normal appearance, no masses or tenderness Heart: regular rate and rhythm Abdomen: soft, non-tender; bowel sounds normal; no masses,  no organomegaly Extremities: extremities normal, atraumatic, no cyanosis or edema Skin: Skin color, texture, turgor normal. No rashes or lesions Lymph nodes: Cervical, supraclavicular, and axillary nodes normal. No abnormal inguinal nodes palpated Neurologic: Grossly normal   Pelvic: External genitalia:  no lesions              Urethra:  normal appearing urethra with no masses, tenderness or lesions              Bartholins and Skenes: normal                 Vagina: normal appearing vagina with normal color and discharge, no lesions              Cervix: no lesions              Pap taken: Yes.   Bimanual Exam:  Uterus:  normal size, contour, position, consistency, mobility, non-tender              Adnexa: normal adnexa and no mass, fullness, tenderness               Rectovaginal: Confirms               Anus:  normal sphincter tone, no lesions  Chaperone was present for exam.  A:  Well Woman with normal exam PMP, no HRT Vaginal atrophic changes  Osteopenia, on Reclast.  Will have repeat BMD this year with Dr. Osborne Casco. H/O Afib but no recent issues with it  P: Mammogram due scheduled for pt today. pap smear last year. No pap today. Labs and vaccines with Dr. Osborne Casco.  Labs from 2016 will be scanned into EPIC.   return annually or prn

## 2015-11-09 LAB — IPS PAP SMEAR ONLY

## 2015-11-19 ENCOUNTER — Other Ambulatory Visit: Payer: Self-pay

## 2015-11-19 DIAGNOSIS — Z1231 Encounter for screening mammogram for malignant neoplasm of breast: Secondary | ICD-10-CM

## 2015-11-23 DIAGNOSIS — H6123 Impacted cerumen, bilateral: Secondary | ICD-10-CM | POA: Diagnosis not present

## 2015-11-23 DIAGNOSIS — H903 Sensorineural hearing loss, bilateral: Secondary | ICD-10-CM | POA: Diagnosis not present

## 2015-12-15 ENCOUNTER — Ambulatory Visit
Admission: RE | Admit: 2015-12-15 | Discharge: 2015-12-15 | Disposition: A | Discharge status: Home / Self Care | Payer: Medicare Other | Source: Ambulatory Visit

## 2015-12-15 DIAGNOSIS — Z1231 Encounter for screening mammogram for malignant neoplasm of breast: Secondary | ICD-10-CM

## 2015-12-20 DIAGNOSIS — W19XXXA Unspecified fall, initial encounter: Secondary | ICD-10-CM | POA: Diagnosis not present

## 2015-12-20 DIAGNOSIS — H2513 Age-related nuclear cataract, bilateral: Secondary | ICD-10-CM | POA: Diagnosis not present

## 2016-01-05 DIAGNOSIS — S40869A Insect bite (nonvenomous) of unspecified upper arm, initial encounter: Secondary | ICD-10-CM | POA: Diagnosis not present

## 2016-01-05 DIAGNOSIS — Z6820 Body mass index (BMI) 20.0-20.9, adult: Secondary | ICD-10-CM | POA: Diagnosis not present

## 2016-01-05 DIAGNOSIS — R209 Unspecified disturbances of skin sensation: Secondary | ICD-10-CM | POA: Diagnosis not present

## 2016-02-11 DIAGNOSIS — F418 Other specified anxiety disorders: Secondary | ICD-10-CM | POA: Diagnosis not present

## 2016-02-11 DIAGNOSIS — Z6820 Body mass index (BMI) 20.0-20.9, adult: Secondary | ICD-10-CM | POA: Diagnosis not present

## 2016-02-11 DIAGNOSIS — K210 Gastro-esophageal reflux disease with esophagitis: Secondary | ICD-10-CM | POA: Diagnosis not present

## 2016-02-11 DIAGNOSIS — R0789 Other chest pain: Secondary | ICD-10-CM | POA: Diagnosis not present

## 2016-02-18 DIAGNOSIS — G500 Trigeminal neuralgia: Secondary | ICD-10-CM | POA: Diagnosis not present

## 2016-02-18 DIAGNOSIS — Z0000 Encounter for general adult medical examination without abnormal findings: Secondary | ICD-10-CM | POA: Diagnosis not present

## 2016-02-18 DIAGNOSIS — M859 Disorder of bone density and structure, unspecified: Secondary | ICD-10-CM | POA: Diagnosis not present

## 2016-02-18 DIAGNOSIS — K219 Gastro-esophageal reflux disease without esophagitis: Secondary | ICD-10-CM | POA: Diagnosis not present

## 2016-02-25 DIAGNOSIS — M546 Pain in thoracic spine: Secondary | ICD-10-CM | POA: Diagnosis not present

## 2016-02-25 DIAGNOSIS — Z6820 Body mass index (BMI) 20.0-20.9, adult: Secondary | ICD-10-CM | POA: Diagnosis not present

## 2016-02-25 DIAGNOSIS — Z0000 Encounter for general adult medical examination without abnormal findings: Secondary | ICD-10-CM | POA: Diagnosis not present

## 2016-02-25 DIAGNOSIS — G500 Trigeminal neuralgia: Secondary | ICD-10-CM | POA: Diagnosis not present

## 2016-02-25 DIAGNOSIS — K210 Gastro-esophageal reflux disease with esophagitis: Secondary | ICD-10-CM | POA: Diagnosis not present

## 2016-02-25 DIAGNOSIS — F418 Other specified anxiety disorders: Secondary | ICD-10-CM | POA: Diagnosis not present

## 2016-02-25 DIAGNOSIS — E871 Hypo-osmolality and hyponatremia: Secondary | ICD-10-CM | POA: Diagnosis not present

## 2016-02-25 DIAGNOSIS — M542 Cervicalgia: Secondary | ICD-10-CM | POA: Diagnosis not present

## 2016-02-25 DIAGNOSIS — H9190 Unspecified hearing loss, unspecified ear: Secondary | ICD-10-CM | POA: Diagnosis not present

## 2016-02-25 DIAGNOSIS — Z1389 Encounter for screening for other disorder: Secondary | ICD-10-CM | POA: Diagnosis not present

## 2016-02-25 DIAGNOSIS — K589 Irritable bowel syndrome without diarrhea: Secondary | ICD-10-CM | POA: Diagnosis not present

## 2016-02-25 DIAGNOSIS — F321 Major depressive disorder, single episode, moderate: Secondary | ICD-10-CM | POA: Diagnosis not present

## 2016-03-22 ENCOUNTER — Ambulatory Visit (INDEPENDENT_AMBULATORY_CARE_PROVIDER_SITE_OTHER): Payer: Medicare Other | Admitting: Podiatry

## 2016-03-22 ENCOUNTER — Encounter: Payer: Self-pay | Admitting: Podiatry

## 2016-03-22 DIAGNOSIS — M6740 Ganglion, unspecified site: Secondary | ICD-10-CM | POA: Diagnosis not present

## 2016-03-22 NOTE — Patient Instructions (Signed)
Today I punctured the cyst on your fourth right toe releasing synovial fluid. This cyst could recur. Apply topical antibiotic ointment and a Band-Aid until the skin heals

## 2016-03-22 NOTE — Progress Notes (Signed)
   Subjective:    Patient ID: April Decker, female    DOB: Mar 08, 1941, 75 y.o.   MRN: [id number]  HPI    This patient presents today with approximately one-year history of a skin lesion on the dorsal fourth right toe. Patient describes the lesion spontaneous resolving in the past with reoccurrence of the lesion. She has some occasional discomfort with direct shoe pressure. The lesion has increased in size slightly over time.   Review of Systems  Skin: Positive for color change.       Objective:   Physical Exam  Orientated 3  Vascular: No calf edema or calf tenderness bilaterally DP and PT pulses 2/4 bilaterally Capillary reflex immediate bilaterally  Neurological: Sensation to 10 g monofilament wire intact 5/5 bilaterally Vibratory sensation intact bilaterally Ankle reflex equal and reactive bilaterally  Dermatological: No open skin lesions bilaterally Raised 10 mm translucent skin lesion dorsal fourth right toe. No surrounding erythema, edema, warmth  Musculoskeletal: No restriction ankle, subtalar, midtarsal joints bilaterally       Assessment & Plan:    Assessment: Satisfactory neurovascular status Mucinoid cyst dorsal fourth right toe  Plan: Advised patient that this lesion most likely is consistent with a mucinoid cyst. At this time I recommended simple puncture of the lesion with compression and application of antibiotic ointment until healed. I made aware of these lesions could reoccur, however, less this cyst  became extremely uncomfortable with shoe wearing that occasional drainage is adequate. Patient verbally consents Skin is prepped with with alcohol on the fourth left toe Utilizing a sterile 20-gauge medial the lesion was needle releasing a clear viscous fluid, consistent with synovial fluid. The lesion collapsed upon release of the fluid. An antibiotic compression dressing was applied. Patient instructed to continue applying topical antibiotic  ointment to this area daily until healed  Reappoint at patient's request

## 2016-06-15 DIAGNOSIS — H2513 Age-related nuclear cataract, bilateral: Secondary | ICD-10-CM | POA: Diagnosis not present

## 2016-06-16 DIAGNOSIS — R1312 Dysphagia, oropharyngeal phase: Secondary | ICD-10-CM | POA: Diagnosis not present

## 2016-06-16 DIAGNOSIS — K219 Gastro-esophageal reflux disease without esophagitis: Secondary | ICD-10-CM | POA: Diagnosis not present

## 2016-06-16 DIAGNOSIS — Z681 Body mass index (BMI) 19 or less, adult: Secondary | ICD-10-CM | POA: Diagnosis not present

## 2016-06-16 DIAGNOSIS — M542 Cervicalgia: Secondary | ICD-10-CM | POA: Diagnosis not present

## 2016-06-16 DIAGNOSIS — R05 Cough: Secondary | ICD-10-CM | POA: Diagnosis not present

## 2016-06-16 DIAGNOSIS — J069 Acute upper respiratory infection, unspecified: Secondary | ICD-10-CM | POA: Diagnosis not present

## 2016-07-19 DIAGNOSIS — K219 Gastro-esophageal reflux disease without esophagitis: Secondary | ICD-10-CM | POA: Diagnosis not present

## 2016-07-19 DIAGNOSIS — R05 Cough: Secondary | ICD-10-CM | POA: Diagnosis not present

## 2016-07-19 DIAGNOSIS — R490 Dysphonia: Secondary | ICD-10-CM | POA: Diagnosis not present

## 2016-07-20 ENCOUNTER — Other Ambulatory Visit (INDEPENDENT_AMBULATORY_CARE_PROVIDER_SITE_OTHER): Payer: Self-pay | Admitting: Otolaryngology

## 2016-07-20 DIAGNOSIS — R1310 Dysphagia, unspecified: Secondary | ICD-10-CM

## 2016-07-29 DIAGNOSIS — Z23 Encounter for immunization: Secondary | ICD-10-CM | POA: Diagnosis not present

## 2016-07-31 DIAGNOSIS — Z8679 Personal history of other diseases of the circulatory system: Secondary | ICD-10-CM | POA: Diagnosis not present

## 2016-07-31 DIAGNOSIS — Z6820 Body mass index (BMI) 20.0-20.9, adult: Secondary | ICD-10-CM | POA: Diagnosis not present

## 2016-07-31 DIAGNOSIS — I10 Essential (primary) hypertension: Secondary | ICD-10-CM | POA: Diagnosis not present

## 2016-07-31 DIAGNOSIS — R5383 Other fatigue: Secondary | ICD-10-CM | POA: Diagnosis not present

## 2016-08-28 DIAGNOSIS — K219 Gastro-esophageal reflux disease without esophagitis: Secondary | ICD-10-CM | POA: Diagnosis not present

## 2016-08-28 DIAGNOSIS — K589 Irritable bowel syndrome without diarrhea: Secondary | ICD-10-CM | POA: Diagnosis not present

## 2016-08-28 DIAGNOSIS — M859 Disorder of bone density and structure, unspecified: Secondary | ICD-10-CM | POA: Diagnosis not present

## 2016-08-28 DIAGNOSIS — M1990 Unspecified osteoarthritis, unspecified site: Secondary | ICD-10-CM | POA: Diagnosis not present

## 2016-08-28 DIAGNOSIS — K210 Gastro-esophageal reflux disease with esophagitis: Secondary | ICD-10-CM | POA: Diagnosis not present

## 2016-08-28 DIAGNOSIS — H9190 Unspecified hearing loss, unspecified ear: Secondary | ICD-10-CM | POA: Diagnosis not present

## 2016-08-28 DIAGNOSIS — Z681 Body mass index (BMI) 19 or less, adult: Secondary | ICD-10-CM | POA: Diagnosis not present

## 2016-08-28 DIAGNOSIS — F321 Major depressive disorder, single episode, moderate: Secondary | ICD-10-CM | POA: Diagnosis not present

## 2016-08-28 DIAGNOSIS — F418 Other specified anxiety disorders: Secondary | ICD-10-CM | POA: Diagnosis not present

## 2016-08-28 DIAGNOSIS — E871 Hypo-osmolality and hyponatremia: Secondary | ICD-10-CM | POA: Diagnosis not present

## 2016-08-30 ENCOUNTER — Ambulatory Visit (HOSPITAL_COMMUNITY)
Admission: RE | Admit: 2016-08-30 | Discharge: 2016-08-30 | Disposition: A | Discharge status: Home / Self Care | Payer: Medicare Other | Source: Ambulatory Visit | Attending: Otolaryngology | Admitting: Otolaryngology

## 2016-08-30 DIAGNOSIS — K222 Esophageal obstruction: Secondary | ICD-10-CM | POA: Diagnosis not present

## 2016-08-30 DIAGNOSIS — K449 Diaphragmatic hernia without obstruction or gangrene: Secondary | ICD-10-CM | POA: Insufficient documentation

## 2016-08-30 DIAGNOSIS — R1310 Dysphagia, unspecified: Secondary | ICD-10-CM | POA: Diagnosis present

## 2016-09-12 DIAGNOSIS — R490 Dysphonia: Secondary | ICD-10-CM | POA: Diagnosis not present

## 2016-09-12 DIAGNOSIS — K219 Gastro-esophageal reflux disease without esophagitis: Secondary | ICD-10-CM | POA: Diagnosis not present

## 2016-09-26 DIAGNOSIS — Z79899 Other long term (current) drug therapy: Secondary | ICD-10-CM | POA: Diagnosis not present

## 2016-09-26 DIAGNOSIS — Z681 Body mass index (BMI) 19 or less, adult: Secondary | ICD-10-CM | POA: Diagnosis not present

## 2016-09-26 DIAGNOSIS — M859 Disorder of bone density and structure, unspecified: Secondary | ICD-10-CM | POA: Diagnosis not present

## 2016-10-02 DIAGNOSIS — M859 Disorder of bone density and structure, unspecified: Secondary | ICD-10-CM | POA: Diagnosis not present

## 2016-10-06 DIAGNOSIS — L814 Other melanin hyperpigmentation: Secondary | ICD-10-CM | POA: Diagnosis not present

## 2016-10-06 DIAGNOSIS — L578 Other skin changes due to chronic exposure to nonionizing radiation: Secondary | ICD-10-CM | POA: Diagnosis not present

## 2016-10-06 DIAGNOSIS — L570 Actinic keratosis: Secondary | ICD-10-CM | POA: Diagnosis not present

## 2016-10-06 DIAGNOSIS — D2272 Melanocytic nevi of left lower limb, including hip: Secondary | ICD-10-CM | POA: Diagnosis not present

## 2016-10-06 DIAGNOSIS — D225 Melanocytic nevi of trunk: Secondary | ICD-10-CM | POA: Diagnosis not present

## 2016-10-06 DIAGNOSIS — M71342 Other bursal cyst, left hand: Secondary | ICD-10-CM | POA: Diagnosis not present

## 2016-10-06 DIAGNOSIS — L821 Other seborrheic keratosis: Secondary | ICD-10-CM | POA: Diagnosis not present

## 2016-10-06 DIAGNOSIS — M71341 Other bursal cyst, right hand: Secondary | ICD-10-CM | POA: Diagnosis not present

## 2016-10-06 DIAGNOSIS — D1801 Hemangioma of skin and subcutaneous tissue: Secondary | ICD-10-CM | POA: Diagnosis not present

## 2016-10-06 DIAGNOSIS — Z85828 Personal history of other malignant neoplasm of skin: Secondary | ICD-10-CM | POA: Diagnosis not present

## 2016-10-23 ENCOUNTER — Ambulatory Visit (HOSPITAL_COMMUNITY): Payer: Medicare Other

## 2016-10-24 DIAGNOSIS — K219 Gastro-esophageal reflux disease without esophagitis: Secondary | ICD-10-CM | POA: Diagnosis not present

## 2016-10-24 DIAGNOSIS — R490 Dysphonia: Secondary | ICD-10-CM | POA: Diagnosis not present

## 2016-11-06 ENCOUNTER — Other Ambulatory Visit: Payer: Self-pay | Admitting: Internal Medicine

## 2016-11-06 DIAGNOSIS — Z1231 Encounter for screening mammogram for malignant neoplasm of breast: Secondary | ICD-10-CM

## 2016-11-20 ENCOUNTER — Ambulatory Visit (HOSPITAL_COMMUNITY): Admission: RE | Admit: 2016-11-20 | Payer: Medicare Other | Source: Ambulatory Visit

## 2016-11-29 DIAGNOSIS — L821 Other seborrheic keratosis: Secondary | ICD-10-CM | POA: Diagnosis not present

## 2016-11-29 DIAGNOSIS — B078 Other viral warts: Secondary | ICD-10-CM | POA: Diagnosis not present

## 2016-11-29 DIAGNOSIS — Z85828 Personal history of other malignant neoplasm of skin: Secondary | ICD-10-CM | POA: Diagnosis not present

## 2016-12-11 ENCOUNTER — Encounter: Payer: Self-pay | Admitting: Obstetrics & Gynecology

## 2016-12-11 ENCOUNTER — Ambulatory Visit (INDEPENDENT_AMBULATORY_CARE_PROVIDER_SITE_OTHER): Payer: Medicare Other | Admitting: Obstetrics & Gynecology

## 2016-12-11 VITALS — BP 112/76 | HR 70 | Resp 12 | Ht 65.5 in | Wt 124.0 lb

## 2016-12-11 DIAGNOSIS — Z01419 Encounter for gynecological examination (general) (routine) without abnormal findings: Secondary | ICD-10-CM | POA: Diagnosis not present

## 2016-12-11 DIAGNOSIS — Z124 Encounter for screening for malignant neoplasm of cervix: Secondary | ICD-10-CM

## 2016-12-11 NOTE — Progress Notes (Signed)
77 y.o. G51P0010 Married Caucasian F here for annual exam.  Doing well.  Has traveled a little this year on the [place].    Denies vaginal bleeding.  Does have some issues with internal hemorrhoids.    Denies vaginal bleeding.    Still receiving Reclast.  Reports had recent BMD that showed BMD was stable.  Dr. Osborne Casco is following this.   Pt reports she is getting regular massages and she does have some associated breast pain with massages.     PCP:  Dr. Osborne Casco.  Is seen every six months.    Patient's last menstrual period was 09/19/1995.          Sexually active: No.  The current method of family planning is post menopausal status.    Exercising: No.  The patient does not participate in regular exercise at present. Smoker:  no  Health Maintenance: Pap:  11/05/15 negative, 08/08/13 negative  History of abnormal Pap:  no MMG:  12/15/15 BIRADS 1 negative  Colonoscopy:  09/28/15 Dr. Silverio Decamp  BMD:   2015  TDaP:  06/19/12  Pneumonia vaccine(s):  Done with PCP  Zostavax:   Done with PCP  Hep C testing: not indicated  Screening Labs: PCP, Hb today: PCP   reports that she has never smoked. She has never used smokeless tobacco. She reports that she does not drink alcohol or use drugs.  Past Medical History:  Diagnosis Date  . A-fib ([place])   . Allergy   . Anxiety   . 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 Atrophic vaginitis   . Cataract    bil eyes - "beginning" per pt  . Chronic constipation 2009  . Depressed   . GERD (gastroesophageal reflux disease)   . Hemorrhoids   . Hypertension   . Osteoporosis   . Rash and nonspecific skin eruption    of arms and legs intermittantly. Relieved w anti-itching cream and lotions  . Shingles 12/15   mild case  . Sight deterioration 2015   of hip    Past Surgical History:  Procedure Laterality Date  . CLOSED REDUCTION COCCYGEAL FRACTURE  09/2003   no surgery  . COLONOSCOPY  2009   attempted 07-19-2015-unable to complete poor prep   .  TONSILLECTOMY      Current Outpatient Prescriptions  Medication Sig Dispense Refill  . aspirin EC 81 MG tablet Take 81 mg by mouth every evening.    Marland Kitchen BIOTIN PO Take 5,000 mcg by mouth every evening.     Marland Kitchen CALCIUM-MAGNESIUM PO Take by mouth. 400mg -200mg  +D3    . Cholecalciferol (VITAMIN D) 2000 UNITS CAPS Take 2,000 Units by mouth every evening.     Marland Kitchen EVENING PRIMROSE OIL PO Take 1,300 mg by mouth every evening.     Marland Kitchen glucosamine-chondroitin 500-400 MG tablet Take 3 tablets by mouth daily.     . Multiple Vitamins-Minerals (MULTIVITAMIN PO) Take 1 tablet by mouth every evening.     . Omega-3 Fatty Acids (OMEGA 3 PO) Take 1,000 mg by mouth every evening.     Marland Kitchen omeprazole (PRILOSEC) 20 MG capsule Take 20 mg by mouth as needed.     . sertraline (ZOLOFT) 25 MG tablet Take 25 mg by mouth.    . valsartan (DIOVAN) 40 MG tablet Take 1 tablet (40 mg total) by mouth daily. 30 tablet 0  . vitamin C (ASCORBIC ACID) 500 MG tablet Take 500 mg by mouth every evening.    . zoledronic acid (  RECLAST) 5 MG/100ML SOLN injection Inject 5 mg into the vein once.     No current facility-administered medications for this visit.     Family History  Problem Relation Age of Onset  . Liver cancer Mother   . Colon cancer Neg Hx   . Esophageal cancer Neg Hx   . Stomach cancer Neg Hx   . Rectal cancer Neg Hx   . Colon polyps Neg Hx     ROS: +breast pain, + bloating, constipation, intermittent changes in bowel habits, +hearing loss Otherwise, complete ROS questions were negative  Exam:   BP 112/76 (BP Location: Right Arm, Patient Position: Sitting, Cuff Size: Normal)   Pulse 70   Resp 12   Ht 5' 5.5" (1.664 m)   Wt 124 lb (56.2 kg)   LMP 09/19/1995   BMI 20.32 kg/m   Weight change: -1#    Height: 5' 5.5" (166.4 cm)  Ht Readings from Last 3 Encounters:  12/11/16 5' 5.5" (1.664 m)  11/05/15 5' 5.75" (1.67 m)  09/28/15 5\' 6"  (1.676 m)   General appearance: alert, cooperative and appears stated age Head:  Normocephalic, without obvious abnormality, atraumatic Neck: no adenopathy, supple, symmetrical, trachea midline and thyroid normal to inspection and palpation Lungs: clear to auscultation bilaterally Breasts: normal appearance, no masses or tenderness Heart: regular rate and rhythm Abdomen: soft, non-tender; bowel sounds normal; no masses,  no organomegaly Extremities: extremities normal, atraumatic, no cyanosis or edema Skin: Skin color, texture, turgor normal. No rashes or lesions Lymph nodes: Cervical, supraclavicular, and axillary nodes normal. No abnormal inguinal nodes palpated Neurologic: Grossly normal   Pelvic: External genitalia:  no lesions              Urethra:  normal appearing urethra with no masses, tenderness or lesions              Bartholins and Skenes: normal                 Vagina: normal appearing vagina with normal color and discharge, no lesions              Cervix: no lesions              Pap taken: No. Bimanual Exam:  Uterus:  normal size, contour, position, consistency, mobility, non-tender              Adnexa: normal adnexa and no mass, fullness, tenderness               Rectovaginal: Confirms               Anus:  normal sphincter tone, no lesions  Chaperone was present for exam.  A:  Well Woman with normal exam PMP, no HRT Vaginal atrophy, declines treatment Osteopenia, on Reclast, Dr. Osborne Casco is following H/O afib.  Seeing Dr. Chong Sicilian every six months.  P:   Mammogram guidelines reviewed. pap smear 2017 was negative.  No pap smear obtained today. Lab work and vaccinations UTD with Dr. Osborne Casco  return annually or prn

## 2016-12-14 ENCOUNTER — Encounter (HOSPITAL_COMMUNITY): Payer: Self-pay

## 2016-12-14 ENCOUNTER — Ambulatory Visit (HOSPITAL_COMMUNITY)
Admission: RE | Admit: 2016-12-14 | Discharge: 2016-12-14 | Disposition: A | Discharge status: Home / Self Care | Payer: Medicare Other | Source: Ambulatory Visit | Attending: Internal Medicine | Admitting: Internal Medicine

## 2016-12-14 DIAGNOSIS — M810 Age-related osteoporosis without current pathological fracture: Secondary | ICD-10-CM | POA: Insufficient documentation

## 2016-12-14 MED ORDER — SODIUM CHLORIDE 0.9 % IV SOLN
Freq: Once | INTRAVENOUS | Status: AC
  Administered 2016-12-14: 14:00:00 via INTRAVENOUS

## 2016-12-14 MED ORDER — ZOLEDRONIC ACID 5 MG/100ML IV SOLN
5.0000 mg | Freq: Once | INTRAVENOUS | Status: AC
  Administered 2016-12-14: 5 mg via INTRAVENOUS
  Filled 2016-12-14: qty 100

## 2016-12-14 NOTE — Discharge Instructions (Signed)
Zoledronic Acid injection (Paget's Disease, Osteoporosis)/ Reclast °What is this medicine? °ZOLEDRONIC ACID (ZOE le dron ik AS id) lowers the amount of calcium loss from bone. It is used to treat Paget's disease and osteoporosis in women. °This medicine may be used for other purposes; ask your health care provider or pharmacist if you have questions. °COMMON BRAND NAME(S): Reclast, Zometa °What should I tell my health care provider before I take this medicine? °They need to know if you have any of these conditions: °-aspirin-sensitive asthma °-cancer, especially if you are receiving medicines used to treat cancer °-dental disease or wear dentures °-infection °-kidney disease °-low levels of calcium in the blood °-past surgery on the parathyroid gland or intestines °-receiving corticosteroids like dexamethasone or prednisone °-an unusual or allergic reaction to zoledronic acid, other medicines, foods, dyes, or preservatives °-pregnant or trying to get pregnant °-breast-feeding °How should I use this medicine? °This medicine is for infusion into a vein. It is given by a health care professional in a hospital or clinic setting. °Talk to your pediatrician regarding the use of this medicine in children. This medicine is not approved for use in children. °Overdosage: If you think you have taken too much of this medicine contact a poison control center or emergency room at once. °NOTE: This medicine is only for you. Do not share this medicine with others. °What if I miss a dose? °It is important not to miss your dose. Call your doctor or health care professional if you are unable to keep an appointment. °What may interact with this medicine? °-certain antibiotics given by injection °-NSAIDs, medicines for pain and inflammation, like ibuprofen or naproxen °-some diuretics like bumetanide, furosemide °-teriparatide °This list may not describe all possible interactions. Give your health care provider a list of all the  medicines, herbs, non-prescription drugs, or dietary supplements you use. Also tell them if you smoke, drink alcohol, or use illegal drugs. Some items may interact with your medicine. °What should I watch for while using this medicine? °Visit your doctor or health care professional for regular checkups. It may be some time before you see the benefit from this medicine. Do not stop taking your medicine unless your doctor tells you to. Your doctor may order blood tests or other tests to see how you are doing. °Women should inform their doctor if they wish to become pregnant or think they might be pregnant. There is a potential for serious side effects to an unborn child. Talk to your health care professional or pharmacist for more information. °You should make sure that you get enough calcium and vitamin D while you are taking this medicine. Discuss the foods you eat and the vitamins you take with your health care professional. °Some people who take this medicine have severe bone, joint, and/or muscle pain. This medicine may also increase your risk for jaw problems or a broken thigh bone. Tell your doctor right away if you have severe pain in your jaw, bones, joints, or muscles. Tell your doctor if you have any pain that does not go away or that gets worse. °Tell your dentist and dental surgeon that you are taking this medicine. You should not have major dental surgery while on this medicine. See your dentist to have a dental exam and fix any dental problems before starting this medicine. Take good care of your teeth while on this medicine. Make sure you see your dentist for regular follow-up appointments. °What side effects may I notice from receiving this   medicine? °Side effects that you should report to your doctor or health care professional as soon as possible: °-allergic reactions like skin rash, itching or hives, swelling of the face, lips, or tongue °-anxiety, confusion, or depression °-breathing  problems °-changes in vision °-eye pain °-feeling faint or lightheaded, falls °-jaw pain, especially after dental work °-mouth sores °-muscle cramps, stiffness, or weakness °-redness, blistering, peeling or loosening of the skin, including inside the mouth °-trouble passing urine or change in the amount of urine °Side effects that usually do not require medical attention (report to your doctor or health care professional if they continue or are bothersome): °-bone, joint, or muscle pain °-constipation °-diarrhea °-fever °-hair loss °-irritation at site where injected °-loss of appetite °-nausea, vomiting °-stomach upset °-trouble sleeping °-trouble swallowing °-weak or tired °This list may not describe all possible side effects. Call your doctor for medical advice about side effects. You may report side effects to FDA at 1-800-FDA-1088. °Where should I keep my medicine? °This drug is given in a hospital or clinic and will not be stored at home. °NOTE: This sheet is a summary. It may not cover all possible information. If you have questions about this medicine, talk to your doctor, pharmacist, or health care provider. °© 2018 [employer] (2014-01-31 14:19:57) ° °

## 2016-12-18 ENCOUNTER — Ambulatory Visit: Payer: Medicare Other | Admitting: Obstetrics & Gynecology

## 2016-12-19 ENCOUNTER — Ambulatory Visit
Admission: RE | Admit: 2016-12-19 | Discharge: 2016-12-19 | Disposition: A | Discharge status: Home / Self Care | Payer: Medicare Other | Source: Ambulatory Visit | Attending: Internal Medicine | Admitting: Internal Medicine

## 2016-12-19 DIAGNOSIS — Z1231 Encounter for screening mammogram for malignant neoplasm of breast: Secondary | ICD-10-CM

## 2017-01-04 DIAGNOSIS — R109 Unspecified abdominal pain: Secondary | ICD-10-CM | POA: Diagnosis not present

## 2017-01-04 DIAGNOSIS — Z681 Body mass index (BMI) 19 or less, adult: Secondary | ICD-10-CM | POA: Diagnosis not present

## 2017-01-04 DIAGNOSIS — K589 Irritable bowel syndrome without diarrhea: Secondary | ICD-10-CM | POA: Diagnosis not present

## 2017-01-29 DIAGNOSIS — F419 Anxiety disorder, unspecified: Secondary | ICD-10-CM | POA: Diagnosis not present

## 2017-01-29 DIAGNOSIS — R42 Dizziness and giddiness: Secondary | ICD-10-CM | POA: Diagnosis not present

## 2017-01-29 DIAGNOSIS — M549 Dorsalgia, unspecified: Secondary | ICD-10-CM | POA: Diagnosis not present

## 2017-01-29 DIAGNOSIS — G479 Sleep disorder, unspecified: Secondary | ICD-10-CM | POA: Diagnosis not present

## 2017-01-29 DIAGNOSIS — Z91013 Allergy to seafood: Secondary | ICD-10-CM | POA: Diagnosis not present

## 2017-01-29 DIAGNOSIS — Z6820 Body mass index (BMI) 20.0-20.9, adult: Secondary | ICD-10-CM | POA: Diagnosis not present

## 2017-01-29 DIAGNOSIS — Z8679 Personal history of other diseases of the circulatory system: Secondary | ICD-10-CM | POA: Diagnosis not present

## 2017-01-29 DIAGNOSIS — I10 Essential (primary) hypertension: Secondary | ICD-10-CM | POA: Diagnosis not present

## 2017-01-29 DIAGNOSIS — H43813 Vitreous degeneration, bilateral: Secondary | ICD-10-CM | POA: Diagnosis not present

## 2017-01-29 DIAGNOSIS — Z8249 Family history of ischemic heart disease and other diseases of the circulatory system: Secondary | ICD-10-CM | POA: Diagnosis not present

## 2017-01-29 DIAGNOSIS — K219 Gastro-esophageal reflux disease without esophagitis: Secondary | ICD-10-CM | POA: Diagnosis not present

## 2017-01-29 DIAGNOSIS — R0789 Other chest pain: Secondary | ICD-10-CM | POA: Diagnosis not present

## 2017-01-29 DIAGNOSIS — Z7982 Long term (current) use of aspirin: Secondary | ICD-10-CM | POA: Diagnosis not present

## 2017-01-29 DIAGNOSIS — G8929 Other chronic pain: Secondary | ICD-10-CM | POA: Diagnosis not present

## 2017-01-29 DIAGNOSIS — F329 Major depressive disorder, single episode, unspecified: Secondary | ICD-10-CM | POA: Diagnosis not present

## 2017-01-29 DIAGNOSIS — Z7983 Long term (current) use of bisphosphonates: Secondary | ICD-10-CM | POA: Diagnosis not present

## 2017-01-29 DIAGNOSIS — R5383 Other fatigue: Secondary | ICD-10-CM | POA: Diagnosis not present

## 2017-02-20 ENCOUNTER — Ambulatory Visit: Payer: Medicare Other | Admitting: Obstetrics & Gynecology

## 2017-02-20 DIAGNOSIS — M859 Disorder of bone density and structure, unspecified: Secondary | ICD-10-CM | POA: Diagnosis not present

## 2017-02-20 DIAGNOSIS — Z0000 Encounter for general adult medical examination without abnormal findings: Secondary | ICD-10-CM | POA: Diagnosis not present

## 2017-02-20 DIAGNOSIS — R8299 Other abnormal findings in urine: Secondary | ICD-10-CM | POA: Diagnosis not present

## 2017-02-20 DIAGNOSIS — F418 Other specified anxiety disorders: Secondary | ICD-10-CM | POA: Diagnosis not present

## 2017-02-27 DIAGNOSIS — Z1389 Encounter for screening for other disorder: Secondary | ICD-10-CM | POA: Diagnosis not present

## 2017-02-27 DIAGNOSIS — K589 Irritable bowel syndrome without diarrhea: Secondary | ICD-10-CM | POA: Diagnosis not present

## 2017-02-27 DIAGNOSIS — H9193 Unspecified hearing loss, bilateral: Secondary | ICD-10-CM | POA: Diagnosis not present

## 2017-02-27 DIAGNOSIS — Z0000 Encounter for general adult medical examination without abnormal findings: Secondary | ICD-10-CM | POA: Diagnosis not present

## 2017-02-27 DIAGNOSIS — Z681 Body mass index (BMI) 19 or less, adult: Secondary | ICD-10-CM | POA: Diagnosis not present

## 2017-02-27 DIAGNOSIS — M546 Pain in thoracic spine: Secondary | ICD-10-CM | POA: Diagnosis not present

## 2017-02-27 DIAGNOSIS — F321 Major depressive disorder, single episode, moderate: Secondary | ICD-10-CM | POA: Diagnosis not present

## 2017-02-27 DIAGNOSIS — F418 Other specified anxiety disorders: Secondary | ICD-10-CM | POA: Diagnosis not present

## 2017-02-27 DIAGNOSIS — E871 Hypo-osmolality and hyponatremia: Secondary | ICD-10-CM | POA: Diagnosis not present

## 2017-02-27 DIAGNOSIS — K219 Gastro-esophageal reflux disease without esophagitis: Secondary | ICD-10-CM | POA: Diagnosis not present

## 2017-02-27 DIAGNOSIS — G500 Trigeminal neuralgia: Secondary | ICD-10-CM | POA: Diagnosis not present

## 2017-02-27 DIAGNOSIS — M1990 Unspecified osteoarthritis, unspecified site: Secondary | ICD-10-CM | POA: Diagnosis not present

## 2017-03-12 ENCOUNTER — Other Ambulatory Visit: Payer: Self-pay | Admitting: Obstetrics & Gynecology

## 2017-03-12 MED ORDER — HYDROCORTISONE 2.5 % RE CREA: 1.0000 "application " | TOPICAL_CREAM | Freq: Two times a day (BID) | RECTAL | 1 refills | Status: DC

## 2017-03-12 NOTE — Progress Notes (Signed)
a 

## 2017-03-15 DIAGNOSIS — F418 Other specified anxiety disorders: Secondary | ICD-10-CM | POA: Diagnosis not present

## 2017-03-15 DIAGNOSIS — F321 Major depressive disorder, single episode, moderate: Secondary | ICD-10-CM | POA: Diagnosis not present

## 2017-04-16 DIAGNOSIS — F321 Major depressive disorder, single episode, moderate: Secondary | ICD-10-CM | POA: Diagnosis not present

## 2017-04-16 DIAGNOSIS — F418 Other specified anxiety disorders: Secondary | ICD-10-CM | POA: Diagnosis not present

## 2017-04-27 DIAGNOSIS — H524 Presbyopia: Secondary | ICD-10-CM | POA: Insufficient documentation

## 2017-04-27 DIAGNOSIS — H40033 Anatomical narrow angle, bilateral: Secondary | ICD-10-CM | POA: Diagnosis not present

## 2017-04-27 DIAGNOSIS — H52203 Unspecified astigmatism, bilateral: Secondary | ICD-10-CM | POA: Insufficient documentation

## 2017-04-27 DIAGNOSIS — H2513 Age-related nuclear cataract, bilateral: Secondary | ICD-10-CM | POA: Diagnosis not present

## 2017-05-07 DIAGNOSIS — F418 Other specified anxiety disorders: Secondary | ICD-10-CM | POA: Diagnosis not present

## 2017-05-07 DIAGNOSIS — F321 Major depressive disorder, single episode, moderate: Secondary | ICD-10-CM | POA: Diagnosis not present

## 2017-07-20 ENCOUNTER — Encounter: Payer: Self-pay | Admitting: Gastroenterology

## 2017-08-14 DIAGNOSIS — Z23 Encounter for immunization: Secondary | ICD-10-CM | POA: Diagnosis not present

## 2017-08-28 DIAGNOSIS — M1990 Unspecified osteoarthritis, unspecified site: Secondary | ICD-10-CM | POA: Diagnosis not present

## 2017-08-28 DIAGNOSIS — F418 Other specified anxiety disorders: Secondary | ICD-10-CM | POA: Diagnosis not present

## 2017-08-28 DIAGNOSIS — K219 Gastro-esophageal reflux disease without esophagitis: Secondary | ICD-10-CM | POA: Diagnosis not present

## 2017-08-28 DIAGNOSIS — K589 Irritable bowel syndrome without diarrhea: Secondary | ICD-10-CM | POA: Diagnosis not present

## 2017-08-28 DIAGNOSIS — Z681 Body mass index (BMI) 19 or less, adult: Secondary | ICD-10-CM | POA: Diagnosis not present

## 2017-08-28 DIAGNOSIS — F321 Major depressive disorder, single episode, moderate: Secondary | ICD-10-CM | POA: Diagnosis not present

## 2017-08-28 DIAGNOSIS — R1312 Dysphagia, oropharyngeal phase: Secondary | ICD-10-CM | POA: Diagnosis not present

## 2017-08-28 DIAGNOSIS — M859 Disorder of bone density and structure, unspecified: Secondary | ICD-10-CM | POA: Diagnosis not present

## 2017-08-28 DIAGNOSIS — H9193 Unspecified hearing loss, bilateral: Secondary | ICD-10-CM | POA: Diagnosis not present

## 2017-08-28 DIAGNOSIS — E871 Hypo-osmolality and hyponatremia: Secondary | ICD-10-CM | POA: Diagnosis not present

## 2017-09-27 DIAGNOSIS — Z681 Body mass index (BMI) 19 or less, adult: Secondary | ICD-10-CM | POA: Diagnosis not present

## 2017-09-27 DIAGNOSIS — M859 Disorder of bone density and structure, unspecified: Secondary | ICD-10-CM | POA: Diagnosis not present

## 2017-10-10 DIAGNOSIS — H2513 Age-related nuclear cataract, bilateral: Secondary | ICD-10-CM | POA: Diagnosis not present

## 2017-10-10 DIAGNOSIS — H40033 Anatomical narrow angle, bilateral: Secondary | ICD-10-CM | POA: Diagnosis not present

## 2017-10-10 DIAGNOSIS — H04123 Dry eye syndrome of bilateral lacrimal glands: Secondary | ICD-10-CM | POA: Diagnosis not present

## 2017-10-15 ENCOUNTER — Other Ambulatory Visit (HOSPITAL_COMMUNITY): Payer: Self-pay

## 2017-10-15 DIAGNOSIS — Z8679 Personal history of other diseases of the circulatory system: Secondary | ICD-10-CM | POA: Diagnosis not present

## 2017-10-15 DIAGNOSIS — R5382 Chronic fatigue, unspecified: Secondary | ICD-10-CM | POA: Diagnosis not present

## 2017-10-15 DIAGNOSIS — I10 Essential (primary) hypertension: Secondary | ICD-10-CM | POA: Diagnosis not present

## 2017-10-16 ENCOUNTER — Ambulatory Visit (HOSPITAL_COMMUNITY)
Admission: RE | Admit: 2017-10-16 | Discharge: 2017-10-16 | Disposition: A | Discharge status: Home / Self Care | Payer: Medicare Other | Source: Ambulatory Visit | Attending: Internal Medicine | Admitting: Internal Medicine

## 2017-10-16 DIAGNOSIS — M810 Age-related osteoporosis without current pathological fracture: Secondary | ICD-10-CM | POA: Diagnosis not present

## 2017-10-16 MED ORDER — ZOLEDRONIC ACID 5 MG/100ML IV SOLN
5.0000 mg | Freq: Once | INTRAVENOUS | Status: AC
  Administered 2017-10-16: 5 mg via INTRAVENOUS

## 2017-10-16 MED ORDER — ZOLEDRONIC ACID 5 MG/100ML IV SOLN
INTRAVENOUS | Status: AC
  Administered 2017-10-16: 5 mg via INTRAVENOUS
  Filled 2017-10-16: qty 100

## 2017-11-21 DIAGNOSIS — D1801 Hemangioma of skin and subcutaneous tissue: Secondary | ICD-10-CM | POA: Diagnosis not present

## 2017-11-21 DIAGNOSIS — L821 Other seborrheic keratosis: Secondary | ICD-10-CM | POA: Diagnosis not present

## 2017-11-21 DIAGNOSIS — L84 Corns and callosities: Secondary | ICD-10-CM | POA: Diagnosis not present

## 2017-11-21 DIAGNOSIS — Z85828 Personal history of other malignant neoplasm of skin: Secondary | ICD-10-CM | POA: Diagnosis not present

## 2017-11-21 DIAGNOSIS — L814 Other melanin hyperpigmentation: Secondary | ICD-10-CM | POA: Diagnosis not present

## 2017-12-05 DIAGNOSIS — H6123 Impacted cerumen, bilateral: Secondary | ICD-10-CM | POA: Diagnosis not present

## 2017-12-05 DIAGNOSIS — H903 Sensorineural hearing loss, bilateral: Secondary | ICD-10-CM | POA: Diagnosis not present

## 2018-01-25 DIAGNOSIS — R5383 Other fatigue: Secondary | ICD-10-CM | POA: Diagnosis not present

## 2018-01-25 DIAGNOSIS — R05 Cough: Secondary | ICD-10-CM | POA: Diagnosis not present

## 2018-01-25 DIAGNOSIS — E871 Hypo-osmolality and hyponatremia: Secondary | ICD-10-CM | POA: Diagnosis not present

## 2018-01-25 DIAGNOSIS — Z681 Body mass index (BMI) 19 or less, adult: Secondary | ICD-10-CM | POA: Diagnosis not present

## 2018-01-25 DIAGNOSIS — H8110 Benign paroxysmal vertigo, unspecified ear: Secondary | ICD-10-CM | POA: Diagnosis not present

## 2018-02-20 ENCOUNTER — Encounter

## 2018-02-22 DIAGNOSIS — M859 Disorder of bone density and structure, unspecified: Secondary | ICD-10-CM | POA: Diagnosis not present

## 2018-02-22 DIAGNOSIS — Z79899 Other long term (current) drug therapy: Secondary | ICD-10-CM | POA: Diagnosis not present

## 2018-02-22 DIAGNOSIS — R82998 Other abnormal findings in urine: Secondary | ICD-10-CM | POA: Diagnosis not present

## 2018-03-01 DIAGNOSIS — R1312 Dysphagia, oropharyngeal phase: Secondary | ICD-10-CM | POA: Diagnosis not present

## 2018-03-01 DIAGNOSIS — H9193 Unspecified hearing loss, bilateral: Secondary | ICD-10-CM | POA: Diagnosis not present

## 2018-03-01 DIAGNOSIS — K589 Irritable bowel syndrome without diarrhea: Secondary | ICD-10-CM | POA: Diagnosis not present

## 2018-03-01 DIAGNOSIS — F331 Major depressive disorder, recurrent, moderate: Secondary | ICD-10-CM | POA: Diagnosis not present

## 2018-03-01 DIAGNOSIS — G500 Trigeminal neuralgia: Secondary | ICD-10-CM | POA: Diagnosis not present

## 2018-03-01 DIAGNOSIS — K219 Gastro-esophageal reflux disease without esophagitis: Secondary | ICD-10-CM | POA: Diagnosis not present

## 2018-03-01 DIAGNOSIS — R5383 Other fatigue: Secondary | ICD-10-CM | POA: Diagnosis not present

## 2018-03-01 DIAGNOSIS — Z1389 Encounter for screening for other disorder: Secondary | ICD-10-CM | POA: Diagnosis not present

## 2018-03-01 DIAGNOSIS — F418 Other specified anxiety disorders: Secondary | ICD-10-CM | POA: Diagnosis not present

## 2018-03-01 DIAGNOSIS — Z681 Body mass index (BMI) 19 or less, adult: Secondary | ICD-10-CM | POA: Diagnosis not present

## 2018-03-01 DIAGNOSIS — M1990 Unspecified osteoarthritis, unspecified site: Secondary | ICD-10-CM | POA: Diagnosis not present

## 2018-03-01 DIAGNOSIS — Z0000 Encounter for general adult medical examination without abnormal findings: Secondary | ICD-10-CM | POA: Diagnosis not present

## 2018-03-04 DIAGNOSIS — Z1212 Encounter for screening for malignant neoplasm of rectum: Secondary | ICD-10-CM | POA: Diagnosis not present

## 2018-03-05 ENCOUNTER — Other Ambulatory Visit: Payer: Self-pay

## 2018-03-05 ENCOUNTER — Ambulatory Visit (INDEPENDENT_AMBULATORY_CARE_PROVIDER_SITE_OTHER): Payer: Medicare Other | Admitting: Obstetrics & Gynecology

## 2018-03-05 ENCOUNTER — Encounter: Payer: Self-pay | Admitting: Obstetrics & Gynecology

## 2018-03-05 ENCOUNTER — Encounter

## 2018-03-05 VITALS — BP 116/62 | HR 68 | Resp 14 | Ht 66.0 in | Wt 120.2 lb

## 2018-03-05 DIAGNOSIS — Z124 Encounter for screening for malignant neoplasm of cervix: Secondary | ICD-10-CM

## 2018-03-05 DIAGNOSIS — Z01419 Encounter for gynecological examination (general) (routine) without abnormal findings: Secondary | ICD-10-CM

## 2018-03-05 DIAGNOSIS — I83893 Varicose veins of bilateral lower extremities with other complications: Secondary | ICD-10-CM

## 2018-03-05 NOTE — Progress Notes (Signed)
77 y.o. G1P0010 MarriedCaucasianF here for annual exam.  Denies vaginal bleeding.    Having increased issues with prominent veins in legs.  Asked cardiologist about this earlier this year.  No recommendations were made.  Would like to see a vein specialist.    Having issues with feeling very tired in the morning.  Patient saw psychologist at Dr. Loren Racer office.  Feels like she is having depression symptoms.  Has been on Zoloft 25mg  daily.  Doesn't feel like this helps.  Has experienced this in the past.  Psychologist left and she hasn't seen anyone else.  Felt talking worked for her.  Suggestions for therapist discussed as well as names given.    Seeing Dr. Sabra Heck, cardiology, at [facility].    Going to move to [place].  Starting to clean out her home.  Finding this to be a huge task.    PCP:  Dr. Osborne Casco.  Last appt was for her routine visit.  She did blood work prior to the appt.  Blood work was all ok.    Patient's last menstrual period was 09/19/1995.          Sexually active: No.  The current method of family planning is post menopausal status.    Exercising: No.   Smoker:  no  Health Maintenance: Pap:  11/05/15 Neg   08/08/13 Neg  History of abnormal Pap:  no MMG:  12/19/16 BIRADS1:Neg  Colonoscopy:  09/28/15 Normal  BMD:   2015  TDaP:  2013  Pneumonia vaccine(s):  done Shingrix:  done Hep C testing: n/a Screening Labs: PCP   reports that she has never smoked. She has never used smokeless tobacco. She reports that she does not drink alcohol or use drugs.  Past Medical History:  Diagnosis Date  . A-fib ([place])   . Anxiety   . 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 Atrophic vaginitis   . Cataract   . Chronic constipation 2009  . Coccygeal fracture ([place]) 2016   close fracture, no surgery  . Depression   . GERD (gastroesophageal reflux disease)   . Hemorrhoids   . Hypertension   . Osteoporosis   . Shingles 12/15   mild case   Past Surgical History:  Procedure Laterality Date   . CLOSED REDUCTION COCCYGEAL FRACTURE  09/2003   no surgery  . COLONOSCOPY  2009   attempted 07-19-2015-unable to complete poor prep   . TONSILLECTOMY     Current Outpatient Medications  Medication Sig Dispense Refill  . aspirin EC 81 MG tablet Take 81 mg by mouth every evening.    Marland Kitchen BIOTIN PO Take 5,000 mcg by mouth every evening.     Marland Kitchen CALCIUM-MAGNESIUM PO Take by mouth. 400mg -200mg  +D3    . Cholecalciferol (VITAMIN D) 2000 UNITS CAPS Take 2,000 Units by mouth every evening.     Marland Kitchen EVENING PRIMROSE OIL PO Take 1,300 mg by mouth every evening.     Marland Kitchen glucosamine-chondroitin 500-400 MG tablet Take 3 tablets by mouth daily.     Marland Kitchen olmesartan (BENICAR) 5 MG tablet Take 1 tablet by mouth daily.    . Omega-3 Fatty Acids (OMEGA 3 PO) Take 1,000 mg by mouth every evening.     Marland Kitchen omeprazole (PRILOSEC) 20 MG capsule Take 20 mg by mouth as needed.     . sertraline (ZOLOFT) 25 MG tablet Take 25 mg by mouth.    . valsartan (DIOVAN) 40 MG tablet Take 1 tablet (40 mg total) by mouth  daily. 30 tablet 0  . vitamin C (ASCORBIC ACID) 500 MG tablet Take 500 mg by mouth every evening.    . zoledronic acid (RECLAST) 5 MG/100ML SOLN injection Inject 5 mg into the vein once.     No current facility-administered medications for this visit.     Family History  Problem Relation Age of Onset  . Liver cancer Mother   . Colon cancer Neg Hx   . Esophageal cancer Neg Hx   . Stomach cancer Neg Hx   . Rectal cancer Neg Hx   . Colon polyps Neg Hx     Review of Systems  Constitutional: Positive for weight loss.  HENT: Positive for hearing loss.        Craving sweets Clod/heat intolerance   Cardiovascular: Positive for leg swelling.  Gastrointestinal: Positive for constipation.       Bloating Change in quality of stools   Genitourinary: Positive for urgency.       Night urination   Musculoskeletal: Positive for myalgias.  Skin:       Hair loss   Psychiatric/Behavioral: Positive for depression. The patient  is nervous/anxious.   All other systems reviewed and are negative.   Exam:   BP 116/62 (BP Location: Right Arm, Patient Position: Sitting, Cuff Size: Normal)   Pulse 68   Resp 14   Ht 5\' 6"  (1.676 m)   Wt 120 lb 3.2 oz (54.5 kg)   LMP 09/19/1995   BMI 19.40 kg/m    Height: 5\' 6"  (167.6 cm)  Ht Readings from Last 3 Encounters:  03/05/18 5\' 6"  (1.676 m)  10/16/17 5\' 5"  (1.651 m)  12/11/16 5' 5.5" (1.664 m)    General appearance: alert, cooperative and appears stated age Head: Normocephalic, without obvious abnormality, atraumatic Neck: no adenopathy, supple, symmetrical, trachea midline and thyroid normal to inspection and palpation Lungs: clear to auscultation bilaterally Breasts: normal appearance, no masses or tenderness Heart: regular rate and rhythm Abdomen: soft, non-tender; bowel sounds normal; no masses,  no organomegaly Extremities: extremities normal, atraumatic, no cyanosis or edema, significant varicosities in both LE but left does seem worse Skin: Skin color, texture, turgor normal. No rashes or lesions Lymph nodes: Cervical, supraclavicular, and axillary nodes normal. No abnormal inguinal nodes palpated Neurologic: Grossly normal   Pelvic: External genitalia:  no lesions              Urethra:  normal appearing urethra with no masses, tenderness or lesions              Bartholins and Skenes: normal                 Vagina: normal appearing vagina with normal color and discharge, no lesions              Cervix: no lesions              Pap taken: No. Bimanual Exam:  Uterus:  normal size, contour, position, consistency, mobility, non-tender              Adnexa: normal adnexa and no mass, fullness, tenderness               Rectovaginal: Confirms               Anus:  normal sphincter tone, no lesions  Chaperone was present for exam.  A:  Well Woman with normal exam PMP, no HRT Depression, on 25mg  sertiline Osteopenia/osteoporosis, on Reclast.  Followed by Dr.  Osborne Casco H/o  afib.  Seeing cardiology at [facility] every six months. Varicosities H/o colon polyps H/o coccygeal fracture 2016, no surgery required  P:   Mammogram guideline reviewed pap smear not indicated this year Psychologist name given Referral to Dr. Donnetta Hutching regarding varicose veins.  Hopefully, compression stockings will help.  Not really interested in surgery/procedures. return annually or prn

## 2018-03-05 NOTE — Patient Instructions (Signed)
Your last mammogram 4/18.  It's ok to repeat this year or next year.

## 2018-03-08 ENCOUNTER — Encounter: Payer: Self-pay | Admitting: Obstetrics & Gynecology

## 2018-03-11 ENCOUNTER — Other Ambulatory Visit: Payer: Self-pay

## 2018-03-11 DIAGNOSIS — I83893 Varicose veins of bilateral lower extremities with other complications: Secondary | ICD-10-CM

## 2018-04-08 DIAGNOSIS — H40033 Anatomical narrow angle, bilateral: Secondary | ICD-10-CM | POA: Diagnosis not present

## 2018-04-08 DIAGNOSIS — H2513 Age-related nuclear cataract, bilateral: Secondary | ICD-10-CM | POA: Diagnosis not present

## 2018-04-18 DIAGNOSIS — I10 Essential (primary) hypertension: Secondary | ICD-10-CM | POA: Diagnosis not present

## 2018-04-18 DIAGNOSIS — R002 Palpitations: Secondary | ICD-10-CM | POA: Diagnosis not present

## 2018-04-18 DIAGNOSIS — Z8679 Personal history of other diseases of the circulatory system: Secondary | ICD-10-CM | POA: Diagnosis not present

## 2018-04-18 DIAGNOSIS — R5383 Other fatigue: Secondary | ICD-10-CM | POA: Diagnosis not present

## 2018-04-22 DIAGNOSIS — H2513 Age-related nuclear cataract, bilateral: Secondary | ICD-10-CM | POA: Diagnosis not present

## 2018-04-22 DIAGNOSIS — H40033 Anatomical narrow angle, bilateral: Secondary | ICD-10-CM | POA: Diagnosis not present

## 2018-04-25 DIAGNOSIS — H2512 Age-related nuclear cataract, left eye: Secondary | ICD-10-CM | POA: Diagnosis not present

## 2018-04-25 DIAGNOSIS — K219 Gastro-esophageal reflux disease without esophagitis: Secondary | ICD-10-CM | POA: Diagnosis not present

## 2018-04-25 DIAGNOSIS — M549 Dorsalgia, unspecified: Secondary | ICD-10-CM | POA: Diagnosis not present

## 2018-04-25 DIAGNOSIS — F419 Anxiety disorder, unspecified: Secondary | ICD-10-CM | POA: Diagnosis not present

## 2018-04-25 DIAGNOSIS — I480 Paroxysmal atrial fibrillation: Secondary | ICD-10-CM | POA: Diagnosis not present

## 2018-04-25 DIAGNOSIS — I10 Essential (primary) hypertension: Secondary | ICD-10-CM | POA: Diagnosis not present

## 2018-04-25 DIAGNOSIS — Z79899 Other long term (current) drug therapy: Secondary | ICD-10-CM | POA: Diagnosis not present

## 2018-04-25 DIAGNOSIS — G8929 Other chronic pain: Secondary | ICD-10-CM | POA: Diagnosis not present

## 2018-04-25 DIAGNOSIS — Z888 Allergy status to other drugs, medicaments and biological substances status: Secondary | ICD-10-CM | POA: Diagnosis not present

## 2018-04-25 DIAGNOSIS — H2513 Age-related nuclear cataract, bilateral: Secondary | ICD-10-CM | POA: Diagnosis not present

## 2018-04-25 DIAGNOSIS — Z7982 Long term (current) use of aspirin: Secondary | ICD-10-CM | POA: Diagnosis not present

## 2018-04-26 DIAGNOSIS — Z9841 Cataract extraction status, right eye: Secondary | ICD-10-CM | POA: Insufficient documentation

## 2018-05-02 DIAGNOSIS — Z111 Encounter for screening for respiratory tuberculosis: Secondary | ICD-10-CM | POA: Diagnosis not present

## 2018-05-14 ENCOUNTER — Encounter: Payer: Medicare Other | Admitting: Vascular Surgery

## 2018-05-14 ENCOUNTER — Encounter (HOSPITAL_COMMUNITY): Payer: Medicare Other

## 2018-07-25 DIAGNOSIS — R002 Palpitations: Secondary | ICD-10-CM | POA: Diagnosis not present

## 2018-07-25 DIAGNOSIS — Z681 Body mass index (BMI) 19 or less, adult: Secondary | ICD-10-CM | POA: Diagnosis not present

## 2018-07-25 DIAGNOSIS — I10 Essential (primary) hypertension: Secondary | ICD-10-CM | POA: Diagnosis not present

## 2018-07-25 DIAGNOSIS — F331 Major depressive disorder, recurrent, moderate: Secondary | ICD-10-CM | POA: Diagnosis not present

## 2018-07-25 DIAGNOSIS — F418 Other specified anxiety disorders: Secondary | ICD-10-CM | POA: Diagnosis not present

## 2018-08-30 DIAGNOSIS — H2511 Age-related nuclear cataract, right eye: Secondary | ICD-10-CM | POA: Diagnosis not present

## 2018-08-30 DIAGNOSIS — H40039 Anatomical narrow angle, unspecified eye: Secondary | ICD-10-CM | POA: Diagnosis not present

## 2018-08-30 DIAGNOSIS — H43813 Vitreous degeneration, bilateral: Secondary | ICD-10-CM | POA: Diagnosis not present

## 2018-09-05 DIAGNOSIS — M8580 Other specified disorders of bone density and structure, unspecified site: Secondary | ICD-10-CM | POA: Insufficient documentation

## 2018-09-05 DIAGNOSIS — M1990 Unspecified osteoarthritis, unspecified site: Secondary | ICD-10-CM | POA: Insufficient documentation

## 2018-09-05 DIAGNOSIS — K589 Irritable bowel syndrome without diarrhea: Secondary | ICD-10-CM | POA: Insufficient documentation

## 2018-09-05 DIAGNOSIS — F419 Anxiety disorder, unspecified: Secondary | ICD-10-CM | POA: Insufficient documentation

## 2018-09-05 DIAGNOSIS — IMO0001 Reserved for inherently not codable concepts without codable children: Secondary | ICD-10-CM | POA: Insufficient documentation

## 2018-09-05 DIAGNOSIS — G500 Trigeminal neuralgia: Secondary | ICD-10-CM | POA: Insufficient documentation

## 2018-09-05 DIAGNOSIS — K219 Gastro-esophageal reflux disease without esophagitis: Secondary | ICD-10-CM | POA: Insufficient documentation

## 2018-09-05 DIAGNOSIS — R5383 Other fatigue: Secondary | ICD-10-CM | POA: Insufficient documentation

## 2018-09-05 DIAGNOSIS — R1310 Dysphagia, unspecified: Secondary | ICD-10-CM | POA: Insufficient documentation

## 2018-09-05 DIAGNOSIS — F32A Depression, unspecified: Secondary | ICD-10-CM | POA: Insufficient documentation

## 2018-09-05 DIAGNOSIS — H9190 Unspecified hearing loss, unspecified ear: Secondary | ICD-10-CM | POA: Insufficient documentation

## 2018-10-09 DIAGNOSIS — Z85828 Personal history of other malignant neoplasm of skin: Secondary | ICD-10-CM | POA: Diagnosis not present

## 2018-10-09 DIAGNOSIS — L810 Postinflammatory hyperpigmentation: Secondary | ICD-10-CM | POA: Diagnosis not present

## 2018-10-16 DIAGNOSIS — K219 Gastro-esophageal reflux disease without esophagitis: Secondary | ICD-10-CM | POA: Diagnosis not present

## 2018-10-16 DIAGNOSIS — R002 Palpitations: Secondary | ICD-10-CM | POA: Diagnosis not present

## 2018-10-16 DIAGNOSIS — M1990 Unspecified osteoarthritis, unspecified site: Secondary | ICD-10-CM | POA: Diagnosis not present

## 2018-10-16 DIAGNOSIS — R1319 Other dysphagia: Secondary | ICD-10-CM | POA: Diagnosis not present

## 2018-10-16 DIAGNOSIS — F331 Major depressive disorder, recurrent, moderate: Secondary | ICD-10-CM | POA: Diagnosis not present

## 2018-10-16 DIAGNOSIS — F418 Other specified anxiety disorders: Secondary | ICD-10-CM | POA: Diagnosis not present

## 2018-10-16 DIAGNOSIS — M859 Disorder of bone density and structure, unspecified: Secondary | ICD-10-CM | POA: Diagnosis not present

## 2018-10-16 DIAGNOSIS — Z681 Body mass index (BMI) 19 or less, adult: Secondary | ICD-10-CM | POA: Diagnosis not present

## 2018-10-16 DIAGNOSIS — I10 Essential (primary) hypertension: Secondary | ICD-10-CM | POA: Diagnosis not present

## 2018-10-16 DIAGNOSIS — K589 Irritable bowel syndrome without diarrhea: Secondary | ICD-10-CM | POA: Diagnosis not present

## 2018-11-01 DIAGNOSIS — Z9842 Cataract extraction status, left eye: Secondary | ICD-10-CM | POA: Diagnosis not present

## 2018-11-01 DIAGNOSIS — H2511 Age-related nuclear cataract, right eye: Secondary | ICD-10-CM | POA: Diagnosis not present

## 2018-11-01 DIAGNOSIS — H40033 Anatomical narrow angle, bilateral: Secondary | ICD-10-CM | POA: Diagnosis not present

## 2018-11-04 DIAGNOSIS — H903 Sensorineural hearing loss, bilateral: Secondary | ICD-10-CM | POA: Diagnosis not present

## 2018-11-04 DIAGNOSIS — H6123 Impacted cerumen, bilateral: Secondary | ICD-10-CM | POA: Diagnosis not present

## 2018-11-20 ENCOUNTER — Other Ambulatory Visit (HOSPITAL_COMMUNITY): Payer: Self-pay

## 2018-11-20 DIAGNOSIS — R1310 Dysphagia, unspecified: Secondary | ICD-10-CM

## 2018-11-25 DIAGNOSIS — H6122 Impacted cerumen, left ear: Secondary | ICD-10-CM | POA: Diagnosis not present

## 2018-12-05 ENCOUNTER — Encounter (HOSPITAL_COMMUNITY): Payer: Medicare Other

## 2018-12-05 ENCOUNTER — Ambulatory Visit (HOSPITAL_COMMUNITY): Payer: Medicare Other

## 2018-12-05 ENCOUNTER — Encounter (HOSPITAL_COMMUNITY): Payer: Self-pay

## 2019-01-17 DIAGNOSIS — R197 Diarrhea, unspecified: Secondary | ICD-10-CM | POA: Diagnosis not present

## 2019-01-17 DIAGNOSIS — R109 Unspecified abdominal pain: Secondary | ICD-10-CM | POA: Diagnosis not present

## 2019-01-17 DIAGNOSIS — R634 Abnormal weight loss: Secondary | ICD-10-CM | POA: Diagnosis not present

## 2019-01-17 DIAGNOSIS — R42 Dizziness and giddiness: Secondary | ICD-10-CM | POA: Diagnosis not present

## 2019-01-20 DIAGNOSIS — R109 Unspecified abdominal pain: Secondary | ICD-10-CM | POA: Diagnosis not present

## 2019-01-20 DIAGNOSIS — R197 Diarrhea, unspecified: Secondary | ICD-10-CM | POA: Diagnosis not present

## 2019-01-20 DIAGNOSIS — R634 Abnormal weight loss: Secondary | ICD-10-CM | POA: Diagnosis not present

## 2019-01-20 DIAGNOSIS — K219 Gastro-esophageal reflux disease without esophagitis: Secondary | ICD-10-CM | POA: Diagnosis not present

## 2019-01-20 DIAGNOSIS — R42 Dizziness and giddiness: Secondary | ICD-10-CM | POA: Diagnosis not present

## 2019-01-20 DIAGNOSIS — T7840XS Allergy, unspecified, sequela: Secondary | ICD-10-CM | POA: Diagnosis not present

## 2019-02-04 DIAGNOSIS — Z20828 Contact with and (suspected) exposure to other viral communicable diseases: Secondary | ICD-10-CM | POA: Diagnosis not present

## 2019-02-04 DIAGNOSIS — Z1159 Encounter for screening for other viral diseases: Secondary | ICD-10-CM | POA: Diagnosis not present

## 2019-02-06 DIAGNOSIS — H2511 Age-related nuclear cataract, right eye: Secondary | ICD-10-CM | POA: Diagnosis not present

## 2019-02-06 DIAGNOSIS — Z79899 Other long term (current) drug therapy: Secondary | ICD-10-CM | POA: Diagnosis not present

## 2019-02-06 DIAGNOSIS — I4891 Unspecified atrial fibrillation: Secondary | ICD-10-CM | POA: Diagnosis not present

## 2019-02-06 DIAGNOSIS — F419 Anxiety disorder, unspecified: Secondary | ICD-10-CM | POA: Diagnosis not present

## 2019-02-06 DIAGNOSIS — Z7982 Long term (current) use of aspirin: Secondary | ICD-10-CM | POA: Diagnosis not present

## 2019-02-06 DIAGNOSIS — M549 Dorsalgia, unspecified: Secondary | ICD-10-CM | POA: Diagnosis not present

## 2019-02-06 DIAGNOSIS — I10 Essential (primary) hypertension: Secondary | ICD-10-CM | POA: Diagnosis not present

## 2019-02-25 DIAGNOSIS — F3289 Other specified depressive episodes: Secondary | ICD-10-CM | POA: Diagnosis not present

## 2019-02-25 DIAGNOSIS — R634 Abnormal weight loss: Secondary | ICD-10-CM | POA: Diagnosis not present

## 2019-02-25 DIAGNOSIS — R109 Unspecified abdominal pain: Secondary | ICD-10-CM | POA: Diagnosis not present

## 2019-03-04 DIAGNOSIS — T50905A Adverse effect of unspecified drugs, medicaments and biological substances, initial encounter: Secondary | ICD-10-CM | POA: Insufficient documentation

## 2019-03-11 DIAGNOSIS — F332 Major depressive disorder, recurrent severe without psychotic features: Secondary | ICD-10-CM | POA: Diagnosis not present

## 2019-03-11 DIAGNOSIS — T50905A Adverse effect of unspecified drugs, medicaments and biological substances, initial encounter: Secondary | ICD-10-CM | POA: Diagnosis not present

## 2019-03-11 DIAGNOSIS — R634 Abnormal weight loss: Secondary | ICD-10-CM | POA: Diagnosis not present

## 2019-03-11 DIAGNOSIS — F3289 Other specified depressive episodes: Secondary | ICD-10-CM | POA: Diagnosis not present

## 2019-03-28 DIAGNOSIS — F3289 Other specified depressive episodes: Secondary | ICD-10-CM | POA: Diagnosis not present

## 2019-03-28 DIAGNOSIS — F419 Anxiety disorder, unspecified: Secondary | ICD-10-CM | POA: Diagnosis not present

## 2019-04-01 ENCOUNTER — Other Ambulatory Visit (HOSPITAL_COMMUNITY): Payer: Self-pay | Admitting: *Deleted

## 2019-04-01 DIAGNOSIS — R1310 Dysphagia, unspecified: Secondary | ICD-10-CM

## 2019-04-15 ENCOUNTER — Encounter (HOSPITAL_COMMUNITY): Payer: Medicare Other

## 2019-04-15 ENCOUNTER — Encounter (HOSPITAL_COMMUNITY): Payer: Self-pay

## 2019-04-15 ENCOUNTER — Ambulatory Visit (HOSPITAL_COMMUNITY): Payer: Medicare Other

## 2019-04-15 DIAGNOSIS — F332 Major depressive disorder, recurrent severe without psychotic features: Secondary | ICD-10-CM | POA: Diagnosis not present

## 2019-05-13 DIAGNOSIS — F332 Major depressive disorder, recurrent severe without psychotic features: Secondary | ICD-10-CM | POA: Diagnosis not present

## 2019-06-04 DIAGNOSIS — F3289 Other specified depressive episodes: Secondary | ICD-10-CM | POA: Diagnosis not present

## 2019-06-10 DIAGNOSIS — F332 Major depressive disorder, recurrent severe without psychotic features: Secondary | ICD-10-CM | POA: Diagnosis not present

## 2019-06-13 ENCOUNTER — Ambulatory Visit: Payer: Medicare Other | Admitting: Obstetrics & Gynecology

## 2019-06-17 ENCOUNTER — Telehealth: Payer: Self-pay | Admitting: Obstetrics & Gynecology

## 2019-06-17 ENCOUNTER — Ambulatory Visit: Payer: Medicare Other | Admitting: Obstetrics & Gynecology

## 2019-06-17 ENCOUNTER — Encounter: Payer: Self-pay | Admitting: Obstetrics & Gynecology

## 2019-06-17 NOTE — Telephone Encounter (Signed)
Patient [facility] today's appointment. Upon calling on 06/13/19 for covid prescreening questions, patient's husband stated that her "short term memory has become really bad lately".

## 2019-06-17 NOTE — Progress Notes (Deleted)
78 y.o. G93P0010 Married White or Caucasian female here for annual exam.    Patient's last menstrual period was 09/19/1995.          Sexually active: {yes no:314532}  The current method of family planning is post menopausal status.    Exercising: {yes no:314532}  {types:19826} Smoker:  {YES P5382123  Health Maintenance: Pap:  11/05/15 neg   08/08/13 Neg  History of abnormal Pap:  no MMG:  12/19/16 BIRADS1:neg  Colonoscopy:  09/28/15 normal  BMD:   2015 TDaP:  2013 Pneumonia vaccine(s):  done Shingrix:   Done  Hep C testing: n/a Screening Labs: ***   reports that she has never smoked. She has never used smokeless tobacco. She reports that she does not drink alcohol or use drugs.  Past Medical History:  Diagnosis Date  . A-fib ([place])   . Anxiety   . 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 Atrophic vaginitis   . Cataract   . Chronic constipation 2009  . Coccygeal fracture ([place]) 2016   close fracture, no surgery  . Depression   . GERD (gastroesophageal reflux disease)   . Hemorrhoids   . Hypertension   . Osteoporosis   . Shingles 12/15   mild case    Past Surgical History:  Procedure Laterality Date  . TONSILLECTOMY      Current Outpatient Medications  Medication Sig Dispense Refill  . aspirin EC 81 MG tablet Take 81 mg by mouth every evening.    Marland Kitchen BIOTIN PO Take 5,000 mcg by mouth every evening.     Marland Kitchen CALCIUM-MAGNESIUM PO Take by mouth. 400mg -200mg  +D3    . Cholecalciferol (VITAMIN D) 2000 UNITS CAPS Take 2,000 Units by mouth every evening.     Marland Kitchen EVENING PRIMROSE OIL PO Take 1,300 mg by mouth every evening.     Marland Kitchen glucosamine-chondroitin 500-400 MG tablet Take 3 tablets by mouth daily.     Marland Kitchen olmesartan (BENICAR) 5 MG tablet Take 1 tablet by mouth daily.    . Omega-3 Fatty Acids (OMEGA 3 PO) Take 1,000 mg by mouth every evening.     Marland Kitchen omeprazole (PRILOSEC) 20 MG capsule Take 20 mg by mouth as needed.     . sertraline (ZOLOFT) 25 MG tablet Take 25 mg by mouth.    . valsartan  (DIOVAN) 40 MG tablet Take 1 tablet (40 mg total) by mouth daily. 30 tablet 0  . vitamin C (ASCORBIC ACID) 500 MG tablet Take 500 mg by mouth every evening.    . zoledronic acid (RECLAST) 5 MG/100ML SOLN injection Inject 5 mg into the vein once.     No current facility-administered medications for this visit.     Family History  Problem Relation Age of Onset  . Liver cancer Mother   . Colon cancer Neg Hx   . Esophageal cancer Neg Hx   . Stomach cancer Neg Hx   . Rectal cancer Neg Hx   . Colon polyps Neg Hx     Review of Systems  Exam:   LMP 09/19/1995   Height:      Ht Readings from Last 3 Encounters:  03/05/18 5\' 6"  (1.676 m)  10/16/17 5\' 5"  (1.651 m)  12/11/16 5' 5.5" (1.664 m)    General appearance: alert, cooperative and appears stated age Head: Normocephalic, without obvious abnormality, atraumatic Neck: no adenopathy, supple, symmetrical, trachea midline and thyroid {EXAM; THYROID:18604} Lungs: clear to auscultation bilaterally Breasts: {Exam; breast:13139::"normal appearance, no masses or tenderness"} Heart: regular  rate and rhythm Abdomen: soft, non-tender; bowel sounds normal; no masses,  no organomegaly Extremities: extremities normal, atraumatic, no cyanosis or edema Skin: Skin color, texture, turgor normal. No rashes or lesions Lymph nodes: Cervical, supraclavicular, and axillary nodes normal. No abnormal inguinal nodes palpated Neurologic: Grossly normal   Pelvic: External genitalia:  no lesions              Urethra:  normal appearing urethra with no masses, tenderness or lesions              Bartholins and Skenes: normal                 Vagina: normal appearing vagina with normal color and discharge, no lesions              Cervix: {exam; cervix:14595}              Pap taken: {yes no:314532} Bimanual Exam:  Uterus:  {exam; uterus:12215}              Adnexa: {exam; adnexa:12223}               Rectovaginal: Confirms               Anus:  normal sphincter  tone, no lesions  Chaperone was present for exam.  A:  Well Woman with normal exam  P:   {plan; gyn:5269::"mammogram","pap smear","return annually or prn"}

## 2019-07-15 DIAGNOSIS — F332 Major depressive disorder, recurrent severe without psychotic features: Secondary | ICD-10-CM | POA: Diagnosis not present

## 2019-07-15 DIAGNOSIS — Z23 Encounter for immunization: Secondary | ICD-10-CM | POA: Diagnosis not present

## 2019-07-29 DIAGNOSIS — R413 Other amnesia: Secondary | ICD-10-CM | POA: Insufficient documentation

## 2019-07-29 DIAGNOSIS — F0390 Unspecified dementia without behavioral disturbance: Secondary | ICD-10-CM | POA: Insufficient documentation

## 2019-07-29 DIAGNOSIS — F3289 Other specified depressive episodes: Secondary | ICD-10-CM | POA: Diagnosis not present

## 2019-07-30 DIAGNOSIS — I10 Essential (primary) hypertension: Secondary | ICD-10-CM | POA: Diagnosis not present

## 2019-07-30 DIAGNOSIS — R5383 Other fatigue: Secondary | ICD-10-CM | POA: Diagnosis not present

## 2019-07-30 DIAGNOSIS — Z0189 Encounter for other specified special examinations: Secondary | ICD-10-CM | POA: Diagnosis not present

## 2019-07-30 DIAGNOSIS — I4891 Unspecified atrial fibrillation: Secondary | ICD-10-CM | POA: Diagnosis not present

## 2019-07-30 DIAGNOSIS — R413 Other amnesia: Secondary | ICD-10-CM | POA: Diagnosis not present

## 2019-08-01 DIAGNOSIS — E538 Deficiency of other specified B group vitamins: Secondary | ICD-10-CM | POA: Diagnosis not present

## 2019-08-01 DIAGNOSIS — R413 Other amnesia: Secondary | ICD-10-CM | POA: Diagnosis not present

## 2019-08-04 DIAGNOSIS — R413 Other amnesia: Secondary | ICD-10-CM | POA: Diagnosis not present

## 2019-08-05 DIAGNOSIS — R41841 Cognitive communication deficit: Secondary | ICD-10-CM | POA: Diagnosis not present

## 2019-08-05 DIAGNOSIS — R413 Other amnesia: Secondary | ICD-10-CM | POA: Diagnosis not present

## 2019-08-07 DIAGNOSIS — R41841 Cognitive communication deficit: Secondary | ICD-10-CM | POA: Diagnosis not present

## 2019-08-07 DIAGNOSIS — R413 Other amnesia: Secondary | ICD-10-CM | POA: Diagnosis not present

## 2019-08-08 DIAGNOSIS — E538 Deficiency of other specified B group vitamins: Secondary | ICD-10-CM | POA: Diagnosis not present

## 2019-08-12 DIAGNOSIS — R41841 Cognitive communication deficit: Secondary | ICD-10-CM | POA: Diagnosis not present

## 2019-08-12 DIAGNOSIS — R413 Other amnesia: Secondary | ICD-10-CM | POA: Diagnosis not present

## 2019-08-15 DIAGNOSIS — R413 Other amnesia: Secondary | ICD-10-CM | POA: Diagnosis not present

## 2019-08-15 DIAGNOSIS — R41841 Cognitive communication deficit: Secondary | ICD-10-CM | POA: Diagnosis not present

## 2019-08-15 DIAGNOSIS — E538 Deficiency of other specified B group vitamins: Secondary | ICD-10-CM | POA: Diagnosis not present

## 2019-08-20 DIAGNOSIS — F332 Major depressive disorder, recurrent severe without psychotic features: Secondary | ICD-10-CM | POA: Diagnosis not present

## 2019-08-22 DIAGNOSIS — E538 Deficiency of other specified B group vitamins: Secondary | ICD-10-CM | POA: Diagnosis not present

## 2019-09-02 DIAGNOSIS — R413 Other amnesia: Secondary | ICD-10-CM | POA: Diagnosis not present

## 2019-09-02 DIAGNOSIS — R41841 Cognitive communication deficit: Secondary | ICD-10-CM | POA: Diagnosis not present

## 2019-09-04 DIAGNOSIS — R41841 Cognitive communication deficit: Secondary | ICD-10-CM | POA: Diagnosis not present

## 2019-09-04 DIAGNOSIS — R413 Other amnesia: Secondary | ICD-10-CM | POA: Diagnosis not present

## 2019-09-09 DIAGNOSIS — R41841 Cognitive communication deficit: Secondary | ICD-10-CM | POA: Diagnosis not present

## 2019-09-09 DIAGNOSIS — R413 Other amnesia: Secondary | ICD-10-CM | POA: Diagnosis not present

## 2019-09-11 DIAGNOSIS — R41841 Cognitive communication deficit: Secondary | ICD-10-CM | POA: Diagnosis not present

## 2019-09-11 DIAGNOSIS — R413 Other amnesia: Secondary | ICD-10-CM | POA: Diagnosis not present

## 2019-09-16 DIAGNOSIS — R413 Other amnesia: Secondary | ICD-10-CM | POA: Diagnosis not present

## 2019-09-16 DIAGNOSIS — R41841 Cognitive communication deficit: Secondary | ICD-10-CM | POA: Diagnosis not present

## 2019-09-18 DIAGNOSIS — R413 Other amnesia: Secondary | ICD-10-CM | POA: Diagnosis not present

## 2019-09-18 DIAGNOSIS — R41841 Cognitive communication deficit: Secondary | ICD-10-CM | POA: Diagnosis not present

## 2019-09-23 DIAGNOSIS — R41841 Cognitive communication deficit: Secondary | ICD-10-CM | POA: Diagnosis not present

## 2019-09-23 DIAGNOSIS — R413 Other amnesia: Secondary | ICD-10-CM | POA: Diagnosis not present

## 2019-09-25 DIAGNOSIS — R413 Other amnesia: Secondary | ICD-10-CM | POA: Diagnosis not present

## 2019-09-25 DIAGNOSIS — R41841 Cognitive communication deficit: Secondary | ICD-10-CM | POA: Diagnosis not present

## 2019-10-01 DIAGNOSIS — Z23 Encounter for immunization: Secondary | ICD-10-CM | POA: Diagnosis not present

## 2019-10-01 DIAGNOSIS — R413 Other amnesia: Secondary | ICD-10-CM | POA: Diagnosis not present

## 2019-10-01 DIAGNOSIS — R41841 Cognitive communication deficit: Secondary | ICD-10-CM | POA: Diagnosis not present

## 2019-10-02 DIAGNOSIS — R41841 Cognitive communication deficit: Secondary | ICD-10-CM | POA: Diagnosis not present

## 2019-10-02 DIAGNOSIS — R413 Other amnesia: Secondary | ICD-10-CM | POA: Diagnosis not present

## 2019-10-03 DIAGNOSIS — F332 Major depressive disorder, recurrent severe without psychotic features: Secondary | ICD-10-CM | POA: Diagnosis not present

## 2019-10-07 DIAGNOSIS — F0280 Dementia in other diseases classified elsewhere without behavioral disturbance: Secondary | ICD-10-CM | POA: Insufficient documentation

## 2019-10-07 DIAGNOSIS — G309 Alzheimer's disease, unspecified: Secondary | ICD-10-CM | POA: Insufficient documentation

## 2019-10-07 DIAGNOSIS — F3289 Other specified depressive episodes: Secondary | ICD-10-CM | POA: Diagnosis not present

## 2019-10-07 DIAGNOSIS — R413 Other amnesia: Secondary | ICD-10-CM | POA: Diagnosis not present

## 2019-10-08 DIAGNOSIS — R413 Other amnesia: Secondary | ICD-10-CM | POA: Diagnosis not present

## 2019-10-08 DIAGNOSIS — R41841 Cognitive communication deficit: Secondary | ICD-10-CM | POA: Diagnosis not present

## 2019-10-09 DIAGNOSIS — R41841 Cognitive communication deficit: Secondary | ICD-10-CM | POA: Diagnosis not present

## 2019-10-09 DIAGNOSIS — R413 Other amnesia: Secondary | ICD-10-CM | POA: Diagnosis not present

## 2019-10-14 DIAGNOSIS — R413 Other amnesia: Secondary | ICD-10-CM | POA: Diagnosis not present

## 2019-10-14 DIAGNOSIS — R41841 Cognitive communication deficit: Secondary | ICD-10-CM | POA: Diagnosis not present

## 2019-10-15 DIAGNOSIS — F3289 Other specified depressive episodes: Secondary | ICD-10-CM | POA: Diagnosis not present

## 2019-10-15 DIAGNOSIS — Z79899 Other long term (current) drug therapy: Secondary | ICD-10-CM | POA: Diagnosis not present

## 2019-10-15 DIAGNOSIS — R413 Other amnesia: Secondary | ICD-10-CM | POA: Diagnosis not present

## 2019-10-23 DIAGNOSIS — E538 Deficiency of other specified B group vitamins: Secondary | ICD-10-CM | POA: Diagnosis not present

## 2019-11-07 DIAGNOSIS — F332 Major depressive disorder, recurrent severe without psychotic features: Secondary | ICD-10-CM | POA: Diagnosis not present

## 2019-11-10 DIAGNOSIS — Z23 Encounter for immunization: Secondary | ICD-10-CM | POA: Diagnosis not present

## 2019-11-21 DIAGNOSIS — E538 Deficiency of other specified B group vitamins: Secondary | ICD-10-CM | POA: Diagnosis not present

## 2019-12-02 DIAGNOSIS — F332 Major depressive disorder, recurrent severe without psychotic features: Secondary | ICD-10-CM | POA: Diagnosis not present

## 2019-12-08 DIAGNOSIS — F0390 Unspecified dementia without behavioral disturbance: Secondary | ICD-10-CM | POA: Diagnosis not present

## 2019-12-09 DIAGNOSIS — F0390 Unspecified dementia without behavioral disturbance: Secondary | ICD-10-CM | POA: Diagnosis not present

## 2019-12-11 DIAGNOSIS — H43813 Vitreous degeneration, bilateral: Secondary | ICD-10-CM | POA: Diagnosis not present

## 2019-12-11 DIAGNOSIS — H04123 Dry eye syndrome of bilateral lacrimal glands: Secondary | ICD-10-CM | POA: Diagnosis not present

## 2019-12-11 DIAGNOSIS — Z961 Presence of intraocular lens: Secondary | ICD-10-CM | POA: Diagnosis not present

## 2019-12-17 DIAGNOSIS — F0390 Unspecified dementia without behavioral disturbance: Secondary | ICD-10-CM | POA: Diagnosis not present

## 2019-12-17 DIAGNOSIS — F332 Major depressive disorder, recurrent severe without psychotic features: Secondary | ICD-10-CM | POA: Diagnosis not present

## 2019-12-23 DIAGNOSIS — E538 Deficiency of other specified B group vitamins: Secondary | ICD-10-CM | POA: Diagnosis not present

## 2019-12-30 DIAGNOSIS — F332 Major depressive disorder, recurrent severe without psychotic features: Secondary | ICD-10-CM | POA: Insufficient documentation

## 2020-01-02 DIAGNOSIS — F332 Major depressive disorder, recurrent severe without psychotic features: Secondary | ICD-10-CM | POA: Diagnosis not present

## 2020-01-16 ENCOUNTER — Other Ambulatory Visit: Payer: Self-pay | Admitting: Internal Medicine

## 2020-01-16 DIAGNOSIS — R1310 Dysphagia, unspecified: Secondary | ICD-10-CM

## 2020-01-23 DIAGNOSIS — F332 Major depressive disorder, recurrent severe without psychotic features: Secondary | ICD-10-CM | POA: Diagnosis not present

## 2020-01-23 DIAGNOSIS — F419 Anxiety disorder, unspecified: Secondary | ICD-10-CM | POA: Diagnosis not present

## 2020-01-23 DIAGNOSIS — F0280 Dementia in other diseases classified elsewhere without behavioral disturbance: Secondary | ICD-10-CM | POA: Diagnosis not present

## 2020-01-23 DIAGNOSIS — E538 Deficiency of other specified B group vitamins: Secondary | ICD-10-CM | POA: Diagnosis not present

## 2020-01-23 DIAGNOSIS — G309 Alzheimer's disease, unspecified: Secondary | ICD-10-CM | POA: Diagnosis not present

## 2020-02-23 DIAGNOSIS — E538 Deficiency of other specified B group vitamins: Secondary | ICD-10-CM | POA: Diagnosis not present

## 2020-03-26 DIAGNOSIS — E538 Deficiency of other specified B group vitamins: Secondary | ICD-10-CM | POA: Diagnosis not present

## 2020-03-26 DIAGNOSIS — R269 Unspecified abnormalities of gait and mobility: Secondary | ICD-10-CM | POA: Diagnosis not present

## 2020-03-31 ENCOUNTER — Ambulatory Visit (INDEPENDENT_AMBULATORY_CARE_PROVIDER_SITE_OTHER): Payer: Medicare Other | Admitting: Adult Health

## 2020-03-31 ENCOUNTER — Other Ambulatory Visit: Payer: Self-pay

## 2020-03-31 ENCOUNTER — Encounter: Payer: Self-pay | Admitting: Adult Health

## 2020-03-31 DIAGNOSIS — F411 Generalized anxiety disorder: Secondary | ICD-10-CM

## 2020-03-31 DIAGNOSIS — F331 Major depressive disorder, recurrent, moderate: Secondary | ICD-10-CM | POA: Diagnosis not present

## 2020-03-31 NOTE — Progress Notes (Signed)
[facility] MD/PA/NP Initial Note  03/31/2020 5:08 PM April Decker  MRN:  [id number]  Chief Complaint:   HPI:   Accompanied by husband.   Describes mood today as "so-so". Pleasant. Mood symptoms - reports depression and anxiety. Denies irritability. Feels more depressed overall. Does not feel like current medication regimen has offered much relief from symptoms. Current medications are Wellbutrin XL 150mg  every morning and Lexapro 5mg  daily. The Lexapro has been escalated to 20mg  which caused patient to have a "manic" episode. Dose of Lexapro subsequently decreased to 5mg  daily. Has continued to take Wellbutrin daily with little relief from depressive symptoms. Denies any seizure history. No prior history of taking psychotropic medications before moving to [place]. Reports having a difficult time leaving her home and valued items behind. Stating "that's when the depression started. Continues to ruminate about returning home. Feels like therapy has been helpful and is currently working with [employer].  Decreased interest and motivation. Taking medications as prescribed.  Energy levels lower - "I don't have much". Active, does not have a regular exercise routine. Walking on days she "feels like it".  Enjoys some usual interests and activities. Likes to be outside. Married. Lives with husband of 42 years.  Appetite adequate - "not a big appetite". Weight loss - 15 pounds since going to [place] - 105 pounds for a year - previously 5'8".  Sleeps well most nights - "like a baby". Averages 11 to 13 hours - feels tired even after sleeping. Focus and concentration difficulties. Stating "my memory is bad, and I can't remember things". Completing tasks. Managing some aspects of household.  Denies SI or HI. Denies AH or VH.  Previous medication trials: Sertraline 75mg  daily  Visit Diagnosis:    ICD-10-CM   1. Major depressive disorder, recurrent episode, moderate (HCC)  F33.1   2.  Generalized anxiety disorder  F41.1     Past Psychiatric History: Denies psychiatric hospitalization.  Past Medical History:  Past Medical History:  Diagnosis Date  . A-fib ([place])   . Anxiety   . 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 Atrophic vaginitis   . Cataract   . Chronic constipation 2009  . Coccygeal fracture ([place]) 2016   close fracture, no surgery  . Depression   . GERD (gastroesophageal reflux disease)   . Hemorrhoids   . Hypertension   . Osteoporosis   . Shingles 12/15   mild case    Past Surgical History:  Procedure Laterality Date  . TONSILLECTOMY      Family Psychiatric History: Denies any family history of mental illness.  Family History:  Family History  Problem Relation Age of Onset  . Liver cancer Mother   . Colon cancer Neg Hx   . Esophageal cancer Neg Hx   . Stomach cancer Neg Hx   . Rectal cancer Neg Hx   . Colon polyps Neg Hx     Social History:  Social History   Socioeconomic History  . Marital status: Married    Spouse name: Not on file  . Number of children: Not on file  . Years of education: Not on file  . Highest education level: Not on file  Occupational History  . Not on file  Tobacco Use  . Smoking status: Never Smoker  . Smokeless tobacco: Never Used  Substance and Sexual Activity  . Alcohol use: No    Alcohol/week: 0.0 standard drinks    Comment: occ glass of wine  . Drug  use: No  . Sexual activity: Never    Partners: Male    Birth control/protection: Post-menopausal, Abstinence  Other Topics Concern  . Not on file  Social History Narrative  . Not on file   Social Determinants of Health   Financial Resource Strain:   . Difficulty of Paying Living Expenses:   Food Insecurity:   . Worried About Charity fundraiser in the Last Year:   . Arboriculturist in the Last Year:   Transportation Needs:   . Film/video editor (Medical):   Marland Kitchen Lack of Transportation (Non-Medical):   Physical Activity:   . Days of Exercise per  Week:   . Minutes of Exercise per Session:   Stress:   . Feeling of Stress :   Social Connections:   . Frequency of Communication with Friends and Family:   . Frequency of Social Gatherings with Friends and Family:   . Attends Religious Services:   . Active Member of Clubs or Organizations:   . Attends Archivist Meetings:   Marland Kitchen Marital Status:     Allergies:  Allergies  Allergen Reactions  . Diltiazem Swelling    Other reaction(s): SWELLING/EDEMA Other reaction(s): SWELLING/EDEMA Other reaction(s): SWELLING/EDEMA  . Iodides Anaphylaxis  . Other Anaphylaxis and Other (See Comments)    X-Ray Dye--brain stem  Other reaction(s): ANAPHYLAXIS X-Ray Dye--brain stem  X-Ray Dye--brain stem   . Shellfish Allergy Hives  . Iohexol Other (See Comments)     Code: HIVES   Code: HIVES  Code: HIVES    Metabolic Disorder Labs: No results found for: HGBA1C, MPG No results found for: PROLACTIN Lab Results  Component Value Date   CHOL  06/04/2010    156        ATP III CLASSIFICATION:  <200     mg/dL   Desirable  200-239  mg/dL   Borderline High  >=240    mg/dL   High          TRIG 50 06/04/2010   HDL 71 06/04/2010   CHOLHDL 2.2 06/04/2010   VLDL 10 06/04/2010   LDLCALC  06/04/2010    75        Total Cholesterol/HDL:CHD Risk Coronary Heart Disease Risk Table                     Men   Women  1/2 Average Risk   3.4   3.3  Average Risk       5.0   4.4  2 X Average Risk   9.6   7.1  3 X Average Risk  23.4   11.0        Use the calculated Patient Ratio above and the CHD Risk Table to determine the patient's CHD Risk.        ATP III CLASSIFICATION (LDL):  <100     mg/dL   Optimal  100-129  mg/dL   Near or Above                    Optimal  130-159  mg/dL   Borderline  160-189  mg/dL   High  >190     mg/dL   Very High   Lab Results  Component Value Date   TSH 3.028 06/03/2010    Therapeutic Level Labs: No results found for: LITHIUM No results found for:  VALPROATE No components found for:  CBMZ  Current Medications: Current Outpatient Medications  Medication Sig Dispense Refill  .  aspirin EC 81 MG tablet Take 81 mg by mouth every evening.    Marland Kitchen BIOTIN PO Take 5,000 mcg by mouth every evening.     Marland Kitchen CALCIUM-MAGNESIUM PO Take by mouth. 400mg -200mg  +D3    . Cholecalciferol (VITAMIN D) 2000 UNITS CAPS Take 2,000 Units by mouth every evening.     Marland Kitchen EVENING PRIMROSE OIL PO Take 1,300 mg by mouth every evening.     Marland Kitchen glucosamine-chondroitin 500-400 MG tablet Take 3 tablets by mouth daily.     Marland Kitchen olmesartan (BENICAR) 5 MG tablet Take 1 tablet by mouth daily.    . Omega-3 Fatty Acids (OMEGA 3 PO) Take 1,000 mg by mouth every evening.     Marland Kitchen omeprazole (PRILOSEC) 20 MG capsule Take 20 mg by mouth as needed.     . sertraline (ZOLOFT) 25 MG tablet Take 25 mg by mouth.    . valsartan (DIOVAN) 40 MG tablet Take 1 tablet (40 mg total) by mouth daily. 30 tablet 0  . vitamin C (ASCORBIC ACID) 500 MG tablet Take 500 mg by mouth every evening.    . zoledronic acid (RECLAST) 5 MG/100ML SOLN injection Inject 5 mg into the vein once.     No current facility-administered medications for this visit.    Medication Side Effects: none  Orders placed this visit:  No orders of the defined types were placed in this encounter.   Psychiatric Specialty Exam:  Review of Systems  Last menstrual period 09/19/1995.There is no height or weight on file to calculate BMI.  General Appearance: Casual, Neat and Well Groomed  Eye Contact:  Good  Speech:  Clear and Coherent and Normal Rate  Volume:  Normal  Mood:  Anxious and Depressed  Affect:  Appropriate and Congruent  Thought Process:  Coherent and Descriptions of Associations: Circumstantial  Orientation:  NA  Thought Content: Logical   Suicidal Thoughts:  No  Homicidal Thoughts:  No  Memory:  Impaired  Judgement:  Fair  Insight:  Fair  Psychomotor Activity:  Decreased  Concentration:  Concentration: Fair   Recall:  NA  Fund of Knowledge: Fair  Language: Good  Assets:  Communication Skills Desire for Improvement Financial Resources/Insurance Housing Intimacy Leisure Time Physical Health Resilience Social Support Talents/Skills Transportation Vocational/Educational  ADL's:  Intact  Cognition: Impaired,  Mild  Prognosis:  Fair   Screenings: None  Receiving Psychotherapy: Yes   Treatment Plan/Recommendations:   Plan:  PDMP reviewed  1. D/C Lexapro 5mg  daily 2. Increase Wellbutrin XL 150mg  to 300mg  daily to target depression 3. Continue therapy with [employer]  Discussed vitamin supplementation  RTC 4 weeks  Patient advised to contact office with any questions, adverse effects, or acute worsening in signs and symptoms.  Greater than 50% of face to face time with patient was spent on counseling and coordination of care. We discussed medications, vitamin supplementation, diet, exercise, cognitive exercises, and therapeutic modalities to improve mood symptoms.   Aloha Gell, NP

## 2020-04-01 DIAGNOSIS — Z681 Body mass index (BMI) 19 or less, adult: Secondary | ICD-10-CM | POA: Diagnosis not present

## 2020-04-01 DIAGNOSIS — I10 Essential (primary) hypertension: Secondary | ICD-10-CM | POA: Diagnosis not present

## 2020-04-01 DIAGNOSIS — R002 Palpitations: Secondary | ICD-10-CM | POA: Diagnosis not present

## 2020-04-05 ENCOUNTER — Other Ambulatory Visit: Payer: Self-pay

## 2020-04-05 ENCOUNTER — Telehealth: Payer: Self-pay | Admitting: Adult Health

## 2020-04-05 MED ORDER — BUPROPION HCL ER (XL) 300 MG PO TB24: 300.0000 mg | ORAL_TABLET | Freq: Every day | ORAL | 5 refills | Status: AC

## 2020-04-05 NOTE — Telephone Encounter (Signed)
We stopped the Lexapro 5mg  and increase Wellbutrin XL 150mg  to 300mg  daily.

## 2020-04-05 NOTE — Telephone Encounter (Signed)
Pt husband called in to see if he can get a call back to go over the changes made to pt's medication. Pt also would like rx called in for Wellbutrin 300mg  to [facility].

## 2020-04-05 NOTE — Telephone Encounter (Signed)
Script sent  

## 2020-04-06 ENCOUNTER — Telehealth: Payer: Self-pay | Admitting: Adult Health

## 2020-04-06 NOTE — Telephone Encounter (Signed)
Dr. Erlene Quan was notified of updated Rx sent. That's all he needed to discuss.

## 2020-04-06 NOTE — Telephone Encounter (Signed)
See previous phone message already spoke this morning

## 2020-04-06 NOTE — Telephone Encounter (Signed)
Returned call from yesterday.

## 2020-04-14 DIAGNOSIS — F332 Major depressive disorder, recurrent severe without psychotic features: Secondary | ICD-10-CM | POA: Diagnosis not present

## 2020-04-22 DIAGNOSIS — E538 Deficiency of other specified B group vitamins: Secondary | ICD-10-CM | POA: Diagnosis not present

## 2020-04-28 ENCOUNTER — Encounter: Payer: Self-pay | Admitting: Adult Health

## 2020-04-28 ENCOUNTER — Other Ambulatory Visit: Payer: Self-pay

## 2020-04-28 ENCOUNTER — Ambulatory Visit (INDEPENDENT_AMBULATORY_CARE_PROVIDER_SITE_OTHER): Payer: Medicare Other | Admitting: Adult Health

## 2020-04-28 DIAGNOSIS — F411 Generalized anxiety disorder: Secondary | ICD-10-CM

## 2020-04-28 DIAGNOSIS — F331 Major depressive disorder, recurrent, moderate: Secondary | ICD-10-CM | POA: Diagnosis not present

## 2020-04-28 NOTE — Progress Notes (Signed)
April Decker [id number] Jan 17, 1941 79 y.o.  Subjective:   Patient ID:  April Decker is a 79 y.o. (DOB 1941/01/09) female.  Chief Complaint: No chief complaint on file.   HPI April Decker presents to the office today for follow-up of MDD and GAD.  Accompanied by husband.   Describes mood today as "a little better". Pleasant. Mood symptoms - reports decreased depression and anxiety. Denies irritability. Stating "more depressed some days than others.". Not as tearful - but has days where she wants to "cry". Walking most days - 30 to 40 minutes. Husband feels like she is having better days". Reports "exaggerated leaning 2 out of 10 days while walking". Working with [employer]. Improved interest and motivation. Taking medications as prescribed.  Energy levels improved. Active, does not have a regular exercise routine. Walking. Enjoys some usual interests and activities. Married. Lives with husband of 47 years.  Appetite adequate.  Weight gain 3 pounds - 108 pounds for a year - previously 5'8".  Sleeps well most nights. Averages 10 to 11 hours. Not feeling as tired when waking up. Focus and concentration difficulties. Completing some tasks. Managing some aspects of household.  Denies SI or HI. Denies AH or VH.  Previous medication trials: Sertraline 75mg  daily    Review of Systems:  Review of Systems  Musculoskeletal: Negative for gait problem.  Neurological: Negative for tremors.  Psychiatric/Behavioral:       Please refer to HPI    Medications: I have reviewed the patient's current medications.  Current Outpatient Medications  Medication Sig Dispense Refill  . aspirin EC 81 MG tablet Take 81 mg by mouth every evening.    Marland Kitchen BIOTIN PO Take 5,000 mcg by mouth every evening.     Marland Kitchen buPROPion (WELLBUTRIN XL) 300 MG 24 hr tablet Take 1 tablet (300 mg total) by mouth daily. 30 tablet 5  . CALCIUM-MAGNESIUM PO Take by mouth. 400mg -200mg  +D3    . Cholecalciferol (VITAMIN D) 2000 UNITS  CAPS Take 2,000 Units by mouth every evening.     Marland Kitchen EVENING PRIMROSE OIL PO Take 1,300 mg by mouth every evening.     Marland Kitchen glucosamine-chondroitin 500-400 MG tablet Take 3 tablets by mouth daily.     Marland Kitchen olmesartan (BENICAR) 5 MG tablet Take 1 tablet by mouth daily.    . Omega-3 Fatty Acids (OMEGA 3 PO) Take 1,000 mg by mouth every evening.     Marland Kitchen omeprazole (PRILOSEC) 20 MG capsule Take 20 mg by mouth as needed.     . valsartan (DIOVAN) 40 MG tablet Take 1 tablet (40 mg total) by mouth daily. 30 tablet 0  . vitamin C (ASCORBIC ACID) 500 MG tablet Take 500 mg by mouth every evening.    . zoledronic acid (RECLAST) 5 MG/100ML SOLN injection Inject 5 mg into the vein once.     No current facility-administered medications for this visit.    Medication Side Effects: None  Allergies:  Allergies  Allergen Reactions  . Diltiazem Swelling    Other reaction(s): SWELLING/EDEMA Other reaction(s): SWELLING/EDEMA Other reaction(s): SWELLING/EDEMA  . Iodides Anaphylaxis  . Other Anaphylaxis and Other (See Comments)    X-Ray Dye--brain stem  Other reaction(s): ANAPHYLAXIS X-Ray Dye--brain stem  X-Ray Dye--brain stem   . Shellfish Allergy Hives  . Iohexol Other (See Comments)     Code: HIVES   Code: HIVES  Code: HIVES    Past Medical History:  Diagnosis Date  . A-fib ([place])   . Anxiety   .  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 Atrophic vaginitis   . Cataract   . Chronic constipation 2009  . Coccygeal fracture ([place]) 2016   close fracture, no surgery  . Depression   . GERD (gastroesophageal reflux disease)   . Hemorrhoids   . Hypertension   . Osteoporosis   . Shingles 12/15   mild case    Family History  Problem Relation Age of Onset  . Liver cancer Mother   . Colon cancer Neg Hx   . Esophageal cancer Neg Hx   . Stomach cancer Neg Hx   . Rectal cancer Neg Hx   . Colon polyps Neg Hx     Social History   Socioeconomic History  . Marital status: Married    Spouse name: Not on file   . Number of children: Not on file  . Years of education: Not on file  . Highest education level: Not on file  Occupational History  . Not on file  Tobacco Use  . Smoking status: Never Smoker  . Smokeless tobacco: Never Used  Substance and Sexual Activity  . Alcohol use: No    Alcohol/week: 0.0 standard drinks    Comment: occ glass of wine  . Drug use: No  . Sexual activity: Never    Partners: Male    Birth control/protection: Post-menopausal, Abstinence  Other Topics Concern  . Not on file  Social History Narrative  . Not on file   Social Determinants of Health   Financial Resource Strain:   . Difficulty of Paying Living Expenses:   Food Insecurity:   . Worried About Charity fundraiser in the Last Year:   . Arboriculturist in the Last Year:   Transportation Needs:   . Film/video editor (Medical):   Marland Kitchen Lack of Transportation (Non-Medical):   Physical Activity:   . Days of Exercise per Week:   . Minutes of Exercise per Session:   Stress:   . Feeling of Stress :   Social Connections:   . Frequency of Communication with Friends and Family:   . Frequency of Social Gatherings with Friends and Family:   . Attends Religious Services:   . Active Member of Clubs or Organizations:   . Attends Archivist Meetings:   Marland Kitchen Marital Status:   Intimate Partner Violence:   . Fear of Current or Ex-Partner:   . Emotionally Abused:   Marland Kitchen Physically Abused:   . Sexually Abused:     Past Medical History, Surgical history, Social history, and Family history were reviewed and updated as appropriate.   Please see review of systems for further details on the patient's review from today.   Objective:   Physical Exam:  LMP 09/19/1995   Physical Exam Constitutional:      General: She is not in acute distress. Musculoskeletal:        General: No deformity.  Neurological:     Mental Status: She is alert and oriented to person, place, and time.     Coordination:  Coordination normal.  Psychiatric:        Attention and Perception: Attention and perception normal. She does not perceive auditory or visual hallucinations.        Mood and Affect: Mood is anxious and depressed. Affect is not labile, blunt, angry or inappropriate.        Speech: Speech normal.        Behavior: Behavior normal. Behavior is cooperative.  Thought Content: Thought content normal. Thought content is not paranoid or delusional. Thought content does not include homicidal or suicidal ideation. Thought content does not include homicidal or suicidal plan.        Cognition and Memory: Cognition is impaired. Memory is impaired.        Judgment: Judgment normal.     Comments: Insight intact     Lab Review:     Component Value Date/Time   NA 137 01/31/2014 2126   K 4.6 01/31/2014 2126   CL 97 01/31/2014 2126   CO2 28 01/31/2014 2126   GLUCOSE 90 01/31/2014 2126   BUN 13 01/31/2014 2126   CREATININE 0.69 01/31/2014 2126   CALCIUM 9.7 01/31/2014 2126   PROT 7.0 01/31/2014 2126   ALBUMIN 4.0 01/31/2014 2126   AST 33 01/31/2014 2126   ALT 22 01/31/2014 2126   ALKPHOS 37 (L) 01/31/2014 2126   BILITOT 0.4 01/31/2014 2126   GFRNONAA 83 (L) 01/31/2014 2126   GFRAA >90 01/31/2014 2126       Component Value Date/Time   WBC 7.3 01/31/2014 2126   RBC 4.08 01/31/2014 2126   HGB 12.8 01/31/2014 2126   HCT 36.7 01/31/2014 2126   PLT 281 01/31/2014 2126   MCV 90.0 01/31/2014 2126   MCH 31.4 01/31/2014 2126   MCHC 34.9 01/31/2014 2126   RDW 13.1 01/31/2014 2126   LYMPHSABS 2.8 01/31/2014 2126   MONOABS 0.8 01/31/2014 2126   EOSABS 0.1 01/31/2014 2126   BASOSABS 0.0 01/31/2014 2126    No results found for: POCLITH, LITHIUM   No results found for: PHENYTOIN, PHENOBARB, VALPROATE, CBMZ   .res Assessment: Plan:    Plan:  PDMP reviewed  1. Continue Lexapro 5mg  daily 2. Continue Wellbutrin XL 300mg  daily to target depression 3. Continue therapy with [employer]  Discussed vitamin supplementation  RTC 6 weeks  Patient advised to contact office with any questions, adverse effects, or acute worsening in signs and symptoms.  Greater than 50% of face to face time with patient was spent on counseling and coordination of care. We discussed medications, vitamin supplementation, diet, exercise, cognitive exercises, and therapeutic modalities to improve mood symptoms.       There are no diagnoses linked to this encounter.   Please see After Visit Summary for patient specific instructions.  No future appointments.  No orders of the defined types were placed in this encounter.   -------------------------------

## 2020-05-11 DIAGNOSIS — R5383 Other fatigue: Secondary | ICD-10-CM | POA: Diagnosis not present

## 2020-05-11 DIAGNOSIS — I10 Essential (primary) hypertension: Secondary | ICD-10-CM | POA: Diagnosis not present

## 2020-05-11 DIAGNOSIS — I4891 Unspecified atrial fibrillation: Secondary | ICD-10-CM | POA: Diagnosis not present

## 2020-05-11 DIAGNOSIS — Z0189 Encounter for other specified special examinations: Secondary | ICD-10-CM | POA: Diagnosis not present

## 2020-05-13 DIAGNOSIS — F332 Major depressive disorder, recurrent severe without psychotic features: Secondary | ICD-10-CM | POA: Diagnosis not present

## 2020-05-13 DIAGNOSIS — F0280 Dementia in other diseases classified elsewhere without behavioral disturbance: Secondary | ICD-10-CM | POA: Diagnosis not present

## 2020-05-13 DIAGNOSIS — I10 Essential (primary) hypertension: Secondary | ICD-10-CM | POA: Diagnosis not present

## 2020-05-13 DIAGNOSIS — G309 Alzheimer's disease, unspecified: Secondary | ICD-10-CM | POA: Diagnosis not present

## 2020-05-18 DIAGNOSIS — F332 Major depressive disorder, recurrent severe without psychotic features: Secondary | ICD-10-CM | POA: Diagnosis not present

## 2020-05-31 DIAGNOSIS — F0390 Unspecified dementia without behavioral disturbance: Secondary | ICD-10-CM | POA: Diagnosis not present

## 2020-05-31 DIAGNOSIS — R269 Unspecified abnormalities of gait and mobility: Secondary | ICD-10-CM | POA: Diagnosis not present

## 2020-05-31 DIAGNOSIS — F332 Major depressive disorder, recurrent severe without psychotic features: Secondary | ICD-10-CM | POA: Diagnosis not present

## 2020-06-01 DIAGNOSIS — E538 Deficiency of other specified B group vitamins: Secondary | ICD-10-CM | POA: Diagnosis not present

## 2020-06-02 DIAGNOSIS — K581 Irritable bowel syndrome with constipation: Secondary | ICD-10-CM | POA: Diagnosis not present

## 2020-06-10 ENCOUNTER — Ambulatory Visit (INDEPENDENT_AMBULATORY_CARE_PROVIDER_SITE_OTHER): Payer: Medicare Other | Admitting: Adult Health

## 2020-06-10 ENCOUNTER — Other Ambulatory Visit: Payer: Self-pay

## 2020-06-10 ENCOUNTER — Encounter: Payer: Self-pay | Admitting: Adult Health

## 2020-06-10 DIAGNOSIS — F411 Generalized anxiety disorder: Secondary | ICD-10-CM | POA: Diagnosis not present

## 2020-06-10 DIAGNOSIS — F331 Major depressive disorder, recurrent, moderate: Secondary | ICD-10-CM

## 2020-06-10 MED ORDER — ESCITALOPRAM OXALATE 10 MG PO TABS
10.0000 mg | ORAL_TABLET | Freq: Every day | ORAL | 3 refills | Status: AC
Start: 2020-06-10 — End: ?

## 2020-06-10 NOTE — Progress Notes (Signed)
April Decker [id number] [birth year] 79 y.o.  Subjective:   Patient ID:  April Decker is a 79 y.o. (DOB 1941/04/08) female.  Chief Complaint: No chief complaint on file.   HPI April Decker presents to the office today for follow-up of MDD and GAD.  Accompanied by husband.  Describes mood today as "not too good". Pleasant. Increased tearfulness. Mood symptoms - reports depression. Denies anxiety and irritability. Reports tearfulness. Stating "things are not great with me". Mood has declined since last visit. Husband also feels like mood as declined. Also stating "I feel more sad lately". Cannot identify a precipitant. Feels bad that husband has to help her - drive her places.  Working with [employer]. Improved interest and motivation. Taking medications as prescribed.  Energy levels improved. Active, does not have a regular exercise routine. Walking some days. Enjoys some usual interests and activities. Married. Lives with husband of 52 years.  Appetite adequate. Weight stable - 106.8 - 5'8".   Sleeps well most nights. Averages 11 hours. Stating "I feel tired when I get up".  Focus and concentration difficulties. Completing some tasks. Managing some aspects of household. Retired.  Denies SI or HI. Denies AH or VH.  Previous medication trials: Sertraline 75mg  daily   Review of Systems:  Review of Systems  Musculoskeletal: Negative for gait problem.  Neurological: Negative for tremors.  Psychiatric/Behavioral:       Please refer to HPI    Medications: I have reviewed the patient's current medications.  Current Outpatient Medications  Medication Sig Dispense Refill  . aspirin EC 81 MG tablet Take 81 mg by mouth every evening.    Marland Kitchen BIOTIN PO Take 5,000 mcg by mouth every evening.     Marland Kitchen buPROPion (WELLBUTRIN XL) 300 MG 24 hr tablet Take 1 tablet (300 mg total) by mouth daily. 30 tablet 5  . CALCIUM-MAGNESIUM PO Take by mouth. 400mg -200mg  +D3    . Cholecalciferol (VITAMIN D) 2000  UNITS CAPS Take 2,000 Units by mouth every evening.     . escitalopram (LEXAPRO) 10 MG tablet Take 1 tablet (10 mg total) by mouth daily. 90 tablet 3  . EVENING PRIMROSE OIL PO Take 1,300 mg by mouth every evening.     Marland Kitchen glucosamine-chondroitin 500-400 MG tablet Take 3 tablets by mouth daily.     Marland Kitchen olmesartan (BENICAR) 5 MG tablet Take 1 tablet by mouth daily.    . Omega-3 Fatty Acids (OMEGA 3 PO) Take 1,000 mg by mouth every evening.     Marland Kitchen omeprazole (PRILOSEC) 20 MG capsule Take 20 mg by mouth as needed.     . valsartan (DIOVAN) 40 MG tablet Take 1 tablet (40 mg total) by mouth daily. 30 tablet 0  . vitamin C (ASCORBIC ACID) 500 MG tablet Take 500 mg by mouth every evening.    . zoledronic acid (RECLAST) 5 MG/100ML SOLN injection Inject 5 mg into the vein once.     No current facility-administered medications for this visit.    Medication Side Effects: None  Allergies:  Allergies  Allergen Reactions  . Diltiazem Swelling    Other reaction(s): SWELLING/EDEMA Other reaction(s): SWELLING/EDEMA Other reaction(s): SWELLING/EDEMA  . Iodides Anaphylaxis  . Other Anaphylaxis and Other (See Comments)    X-Ray Dye--brain stem  Other reaction(s): ANAPHYLAXIS X-Ray Dye--brain stem  X-Ray Dye--brain stem   . Shellfish Allergy Hives  . Iohexol Other (See Comments)     Code: HIVES   Code: HIVES  Code: HIVES  Past Medical History:  Diagnosis Date  . A-fib ([place])   . Anxiety   . 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 Atrophic vaginitis   . Cataract   . Chronic constipation 2009  . Coccygeal fracture ([place]) 2016   close fracture, no surgery  . Depression   . GERD (gastroesophageal reflux disease)   . Hemorrhoids   . Hypertension   . Osteoporosis   . Shingles 12/15   mild case    Family History  Problem Relation Age of Onset  . Liver cancer Mother   . Colon cancer Neg Hx   . Esophageal cancer Neg Hx   . Stomach cancer Neg Hx   . Rectal cancer Neg Hx   . Colon polyps Neg Hx      Social History   Socioeconomic History  . Marital status: Married    Spouse name: Not on file  . Number of children: Not on file  . Years of education: Not on file  . Highest education level: Not on file  Occupational History  . Not on file  Tobacco Use  . Smoking status: Never Smoker  . Smokeless tobacco: Never Used  Substance and Sexual Activity  . Alcohol use: No    Alcohol/week: 0.0 standard drinks    Comment: occ glass of wine  . Drug use: No  . Sexual activity: Never    Partners: Male    Birth control/protection: Post-menopausal, Abstinence  Other Topics Concern  . Not on file  Social History Narrative  . Not on file   Social Determinants of Health   Financial Resource Strain:   . Difficulty of Paying Living Expenses: Not on file  Food Insecurity:   . Worried About Charity fundraiser in the Last Year: Not on file  . Ran Out of Food in the Last Year: Not on file  Transportation Needs:   . Lack of Transportation (Medical): Not on file  . Lack of Transportation (Non-Medical): Not on file  Physical Activity:   . Days of Exercise per Week: Not on file  . Minutes of Exercise per Session: Not on file  Stress:   . Feeling of Stress : Not on file  Social Connections:   . Frequency of Communication with Friends and Family: Not on file  . Frequency of Social Gatherings with Friends and Family: Not on file  . Attends Religious Services: Not on file  . Active Member of Clubs or Organizations: Not on file  . Attends Archivist Meetings: Not on file  . Marital Status: Not on file  Intimate Partner Violence:   . Fear of Current or Ex-Partner: Not on file  . Emotionally Abused: Not on file  . Physically Abused: Not on file  . Sexually Abused: Not on file    Past Medical History, Surgical history, Social history, and Family history were reviewed and updated as appropriate.   Please see review of systems for further details on the patient's review from  today.   Objective:   Physical Exam:  LMP 09/19/1995   Physical Exam Constitutional:      General: She is not in acute distress. Musculoskeletal:        General: No deformity.  Neurological:     Mental Status: She is alert and oriented to person, place, and time.     Coordination: Coordination normal.  Psychiatric:        Attention and Perception: Attention and perception normal. She does not perceive  auditory or visual hallucinations.        Mood and Affect: Mood normal. Mood is not anxious or depressed. Affect is not labile, blunt, angry or inappropriate.        Speech: Speech normal.        Behavior: Behavior normal.        Thought Content: Thought content normal. Thought content is not paranoid or delusional. Thought content does not include homicidal or suicidal ideation. Thought content does not include homicidal or suicidal plan.        Cognition and Memory: Cognition and memory normal.        Judgment: Judgment normal.     Comments: Insight intact     Lab Review:     Component Value Date/Time   NA 137 01/31/2014 2126   K 4.6 01/31/2014 2126   CL 97 01/31/2014 2126   CO2 28 01/31/2014 2126   GLUCOSE 90 01/31/2014 2126   BUN 13 01/31/2014 2126   CREATININE 0.69 01/31/2014 2126   CALCIUM 9.7 01/31/2014 2126   PROT 7.0 01/31/2014 2126   ALBUMIN 4.0 01/31/2014 2126   AST 33 01/31/2014 2126   ALT 22 01/31/2014 2126   ALKPHOS 37 (L) 01/31/2014 2126   BILITOT 0.4 01/31/2014 2126   GFRNONAA 83 (L) 01/31/2014 2126   GFRAA >90 01/31/2014 2126       Component Value Date/Time   WBC 7.3 01/31/2014 2126   RBC 4.08 01/31/2014 2126   HGB 12.8 01/31/2014 2126   HCT 36.7 01/31/2014 2126   PLT 281 01/31/2014 2126   MCV 90.0 01/31/2014 2126   MCH 31.4 01/31/2014 2126   MCHC 34.9 01/31/2014 2126   RDW 13.1 01/31/2014 2126   LYMPHSABS 2.8 01/31/2014 2126   MONOABS 0.8 01/31/2014 2126   EOSABS 0.1 01/31/2014 2126   BASOSABS 0.0 01/31/2014 2126    No results found for:  POCLITH, LITHIUM   No results found for: PHENYTOIN, PHENOBARB, VALPROATE, CBMZ   .res Assessment: Plan:    Plan:  PDMP reviewed  1. Increase Lexapro 5 to 10 mg daily for worsening depression 2. Continue Wellbutrin XL 300mg  daily to target depression  Continue therapy with [employer]  Magnesium Threonate - BID with plans to increase to TID  RTC 4/6 weeks  Patient advised to contact office with any questions, adverse effects, or acute worsening in signs and symptoms.  Greater than 50% of face to face time with patient was spent on counseling and coordination of care. We discussed medications, vitamin supplementation, diet, exercise, cognitive exercises, and therapeutic modalities to improve mood symptoms.    Diagnoses and all orders for this visit:  Major depressive disorder, recurrent episode, moderate ([facility]) -     escitalopram (LEXAPRO) 10 MG tablet; Take 1 tablet (10 mg total) by mouth daily.  Generalized anxiety disorder     Please see After Visit Summary for patient specific instructions.  Future Appointments  Date Time Provider [place]  07/22/2020  2:00 PM Chace Klippel, Berdie Ogren, NP [facility] None    No orders of the defined types were placed in this encounter.   -------------------------------

## 2020-06-13 DIAGNOSIS — R269 Unspecified abnormalities of gait and mobility: Secondary | ICD-10-CM | POA: Diagnosis not present

## 2020-06-13 DIAGNOSIS — R2681 Unsteadiness on feet: Secondary | ICD-10-CM | POA: Diagnosis not present

## 2020-06-14 DIAGNOSIS — F332 Major depressive disorder, recurrent severe without psychotic features: Secondary | ICD-10-CM | POA: Diagnosis not present

## 2020-06-24 DIAGNOSIS — Z23 Encounter for immunization: Secondary | ICD-10-CM | POA: Diagnosis not present

## 2020-06-24 DIAGNOSIS — E538 Deficiency of other specified B group vitamins: Secondary | ICD-10-CM | POA: Diagnosis not present

## 2020-07-16 DIAGNOSIS — F332 Major depressive disorder, recurrent severe without psychotic features: Secondary | ICD-10-CM | POA: Diagnosis not present

## 2020-07-21 DIAGNOSIS — Z23 Encounter for immunization: Secondary | ICD-10-CM | POA: Diagnosis not present

## 2020-07-22 ENCOUNTER — Ambulatory Visit: Payer: Medicare Other | Admitting: Adult Health

## 2020-07-26 DIAGNOSIS — J302 Other seasonal allergic rhinitis: Secondary | ICD-10-CM | POA: Diagnosis not present

## 2020-07-26 DIAGNOSIS — Z23 Encounter for immunization: Secondary | ICD-10-CM | POA: Diagnosis not present

## 2020-07-26 DIAGNOSIS — E538 Deficiency of other specified B group vitamins: Secondary | ICD-10-CM | POA: Diagnosis not present

## 2020-08-06 DIAGNOSIS — F332 Major depressive disorder, recurrent severe without psychotic features: Secondary | ICD-10-CM | POA: Diagnosis not present

## 2020-08-15 ENCOUNTER — Emergency Department (HOSPITAL_BASED_OUTPATIENT_CLINIC_OR_DEPARTMENT_OTHER): Payer: Medicare Other

## 2020-08-15 ENCOUNTER — Other Ambulatory Visit: Payer: Self-pay

## 2020-08-15 ENCOUNTER — Inpatient Hospital Stay (HOSPITAL_BASED_OUTPATIENT_CLINIC_OR_DEPARTMENT_OTHER)
Admission: EM | Admit: 2020-08-15 | Discharge: 2020-08-20 | DRG: 640 | Disposition: A | Discharge status: Skilled Nursing Facility | Payer: Medicare Other | Source: Skilled Nursing Facility | Attending: Internal Medicine | Admitting: Internal Medicine

## 2020-08-15 ENCOUNTER — Encounter (HOSPITAL_COMMUNITY): Payer: Self-pay | Admitting: Internal Medicine

## 2020-08-15 DIAGNOSIS — G309 Alzheimer's disease, unspecified: Secondary | ICD-10-CM | POA: Diagnosis present

## 2020-08-15 DIAGNOSIS — E860 Dehydration: Secondary | ICD-10-CM | POA: Diagnosis not present

## 2020-08-15 DIAGNOSIS — E43 Unspecified severe protein-calorie malnutrition: Secondary | ICD-10-CM | POA: Diagnosis present

## 2020-08-15 DIAGNOSIS — R531 Weakness: Secondary | ICD-10-CM | POA: Diagnosis not present

## 2020-08-15 DIAGNOSIS — F0281 Dementia in other diseases classified elsewhere with behavioral disturbance: Secondary | ICD-10-CM | POA: Diagnosis present

## 2020-08-15 DIAGNOSIS — E876 Hypokalemia: Secondary | ICD-10-CM | POA: Diagnosis present

## 2020-08-15 DIAGNOSIS — K5900 Constipation, unspecified: Secondary | ICD-10-CM | POA: Diagnosis present

## 2020-08-15 DIAGNOSIS — H9190 Unspecified hearing loss, unspecified ear: Secondary | ICD-10-CM | POA: Diagnosis present

## 2020-08-15 DIAGNOSIS — Z9841 Cataract extraction status, right eye: Secondary | ICD-10-CM

## 2020-08-15 DIAGNOSIS — F0390 Unspecified dementia without behavioral disturbance: Secondary | ICD-10-CM | POA: Diagnosis not present

## 2020-08-15 DIAGNOSIS — R509 Fever, unspecified: Secondary | ICD-10-CM | POA: Diagnosis not present

## 2020-08-15 DIAGNOSIS — J9811 Atelectasis: Secondary | ICD-10-CM | POA: Diagnosis not present

## 2020-08-15 DIAGNOSIS — K219 Gastro-esophageal reflux disease without esophagitis: Secondary | ICD-10-CM | POA: Diagnosis present

## 2020-08-15 DIAGNOSIS — Z888 Allergy status to other drugs, medicaments and biological substances status: Secondary | ICD-10-CM

## 2020-08-15 DIAGNOSIS — F419 Anxiety disorder, unspecified: Secondary | ICD-10-CM | POA: Diagnosis present

## 2020-08-15 DIAGNOSIS — I482 Chronic atrial fibrillation, unspecified: Secondary | ICD-10-CM | POA: Diagnosis present

## 2020-08-15 DIAGNOSIS — R4182 Altered mental status, unspecified: Secondary | ICD-10-CM | POA: Diagnosis present

## 2020-08-15 DIAGNOSIS — I159 Secondary hypertension, unspecified: Secondary | ICD-10-CM

## 2020-08-15 DIAGNOSIS — G9341 Metabolic encephalopathy: Secondary | ICD-10-CM | POA: Diagnosis present

## 2020-08-15 DIAGNOSIS — Z515 Encounter for palliative care: Secondary | ICD-10-CM | POA: Diagnosis not present

## 2020-08-15 DIAGNOSIS — J439 Emphysema, unspecified: Secondary | ICD-10-CM | POA: Diagnosis not present

## 2020-08-15 DIAGNOSIS — F0280 Dementia in other diseases classified elsewhere without behavioral disturbance: Secondary | ICD-10-CM

## 2020-08-15 DIAGNOSIS — J189 Pneumonia, unspecified organism: Secondary | ICD-10-CM | POA: Diagnosis present

## 2020-08-15 DIAGNOSIS — I4891 Unspecified atrial fibrillation: Secondary | ICD-10-CM | POA: Diagnosis not present

## 2020-08-15 DIAGNOSIS — R32 Unspecified urinary incontinence: Secondary | ICD-10-CM | POA: Diagnosis present

## 2020-08-15 DIAGNOSIS — I700 Atherosclerosis of aorta: Secondary | ICD-10-CM | POA: Diagnosis not present

## 2020-08-15 DIAGNOSIS — Z961 Presence of intraocular lens: Secondary | ICD-10-CM | POA: Diagnosis present

## 2020-08-15 DIAGNOSIS — M2550 Pain in unspecified joint: Secondary | ICD-10-CM | POA: Diagnosis not present

## 2020-08-15 DIAGNOSIS — G9340 Encephalopathy, unspecified: Principal | ICD-10-CM

## 2020-08-15 DIAGNOSIS — Z9842 Cataract extraction status, left eye: Secondary | ICD-10-CM | POA: Diagnosis not present

## 2020-08-15 DIAGNOSIS — E162 Hypoglycemia, unspecified: Secondary | ICD-10-CM | POA: Diagnosis present

## 2020-08-15 DIAGNOSIS — R404 Transient alteration of awareness: Secondary | ICD-10-CM | POA: Diagnosis not present

## 2020-08-15 DIAGNOSIS — F32A Depression, unspecified: Secondary | ICD-10-CM | POA: Diagnosis present

## 2020-08-15 DIAGNOSIS — R410 Disorientation, unspecified: Secondary | ICD-10-CM

## 2020-08-15 DIAGNOSIS — M47817 Spondylosis without myelopathy or radiculopathy, lumbosacral region: Secondary | ICD-10-CM | POA: Diagnosis not present

## 2020-08-15 DIAGNOSIS — N179 Acute kidney failure, unspecified: Secondary | ICD-10-CM | POA: Diagnosis not present

## 2020-08-15 DIAGNOSIS — Z20822 Contact with and (suspected) exposure to covid-19: Secondary | ICD-10-CM | POA: Diagnosis present

## 2020-08-15 DIAGNOSIS — Z7189 Other specified counseling: Secondary | ICD-10-CM | POA: Diagnosis not present

## 2020-08-15 DIAGNOSIS — R109 Unspecified abdominal pain: Secondary | ICD-10-CM | POA: Diagnosis not present

## 2020-08-15 DIAGNOSIS — Z91013 Allergy to seafood: Secondary | ICD-10-CM

## 2020-08-15 DIAGNOSIS — Z681 Body mass index (BMI) 19 or less, adult: Secondary | ICD-10-CM

## 2020-08-15 DIAGNOSIS — A419 Sepsis, unspecified organism: Secondary | ICD-10-CM | POA: Diagnosis present

## 2020-08-15 DIAGNOSIS — Z7401 Bed confinement status: Secondary | ICD-10-CM | POA: Diagnosis not present

## 2020-08-15 DIAGNOSIS — R400 Somnolence: Secondary | ICD-10-CM | POA: Diagnosis not present

## 2020-08-15 DIAGNOSIS — Z7982 Long term (current) use of aspirin: Secondary | ICD-10-CM

## 2020-08-15 DIAGNOSIS — I10 Essential (primary) hypertension: Secondary | ICD-10-CM | POA: Diagnosis present

## 2020-08-15 DIAGNOSIS — R64 Cachexia: Secondary | ICD-10-CM | POA: Diagnosis present

## 2020-08-15 DIAGNOSIS — G301 Alzheimer's disease with late onset: Secondary | ICD-10-CM | POA: Diagnosis not present

## 2020-08-15 DIAGNOSIS — Z79899 Other long term (current) drug therapy: Secondary | ICD-10-CM

## 2020-08-15 DIAGNOSIS — Z66 Do not resuscitate: Secondary | ICD-10-CM | POA: Diagnosis present

## 2020-08-15 LAB — CBC WITH DIFFERENTIAL/PLATELET
Abs Immature Granulocytes: 0.29 10*3/uL — ABNORMAL HIGH (ref 0.00–0.07)
Basophils Absolute: 0 10*3/uL (ref 0.0–0.1)
Basophils Relative: 0 %
Eosinophils Absolute: 0 10*3/uL (ref 0.0–0.5)
Eosinophils Relative: 0 %
HCT: 35.3 % — ABNORMAL LOW (ref 36.0–46.0)
Hemoglobin: 12.1 g/dL (ref 12.0–15.0)
Immature Granulocytes: 2 %
Lymphocytes Relative: 2 %
Lymphs Abs: 0.3 10*3/uL — ABNORMAL LOW (ref 0.7–4.0)
MCH: 32.4 pg (ref 26.0–34.0)
MCHC: 34.3 g/dL (ref 30.0–36.0)
MCV: 94.4 fL (ref 80.0–100.0)
Monocytes Absolute: 1 10*3/uL (ref 0.1–1.0)
Monocytes Relative: 6 %
Neutro Abs: 15.7 10*3/uL — ABNORMAL HIGH (ref 1.7–7.7)
Neutrophils Relative %: 90 %
Platelets: 187 10*3/uL (ref 150–400)
RBC: 3.74 MIL/uL — ABNORMAL LOW (ref 3.87–5.11)
RDW: 12.9 % (ref 11.5–15.5)
WBC: 17.3 10*3/uL — ABNORMAL HIGH (ref 4.0–10.5)
nRBC: 0 % (ref 0.0–0.2)

## 2020-08-15 LAB — URINALYSIS, MICROSCOPIC (REFLEX)

## 2020-08-15 LAB — RESP PANEL BY RT-PCR (FLU A&B, COVID) ARPGX2
Influenza A by PCR: NEGATIVE
Influenza B by PCR: NEGATIVE
SARS Coronavirus 2 by RT PCR: NEGATIVE

## 2020-08-15 LAB — URINALYSIS, ROUTINE W REFLEX MICROSCOPIC
Bilirubin Urine: NEGATIVE
Glucose, UA: NEGATIVE mg/dL
Ketones, ur: 15 mg/dL — AB
Leukocytes,Ua: NEGATIVE
Nitrite: NEGATIVE
Protein, ur: NEGATIVE mg/dL
Specific Gravity, Urine: 1.02 (ref 1.005–1.030)
pH: 8 (ref 5.0–8.0)

## 2020-08-15 LAB — COMPREHENSIVE METABOLIC PANEL
ALT: 17 U/L (ref 0–44)
AST: 28 U/L (ref 15–41)
Albumin: 3.5 g/dL (ref 3.5–5.0)
Alkaline Phosphatase: 42 U/L (ref 38–126)
Anion gap: 8 (ref 5–15)
BUN: 26 mg/dL — ABNORMAL HIGH (ref 8–23)
CO2: 24 mmol/L (ref 22–32)
Calcium: 9.2 mg/dL (ref 8.9–10.3)
Chloride: 101 mmol/L (ref 98–111)
Creatinine, Ser: 1.06 mg/dL — ABNORMAL HIGH (ref 0.44–1.00)
GFR, Estimated: 53 mL/min — ABNORMAL LOW (ref >60)
Glucose, Bld: 160 mg/dL — ABNORMAL HIGH (ref 70–99)
Potassium: 3.3 mmol/L — ABNORMAL LOW (ref 3.5–5.1)
Sodium: 133 mmol/L — ABNORMAL LOW (ref 135–145)
Total Bilirubin: 1.2 mg/dL (ref 0.3–1.2)
Total Protein: 6.2 g/dL — ABNORMAL LOW (ref 6.5–8.1)

## 2020-08-15 LAB — PROCALCITONIN: Procalcitonin: 14.93 ng/mL

## 2020-08-15 LAB — BASIC METABOLIC PANEL
Anion gap: 9 (ref 5–15)
BUN: 20 mg/dL (ref 8–23)
CO2: 24 mmol/L (ref 22–32)
Calcium: 8.9 mg/dL (ref 8.9–10.3)
Chloride: 105 mmol/L (ref 98–111)
Creatinine, Ser: 0.9 mg/dL (ref 0.44–1.00)
GFR, Estimated: >60 mL/min (ref >60)
Glucose, Bld: 91 mg/dL (ref 70–99)
Potassium: 3.6 mmol/L (ref 3.5–5.1)
Sodium: 138 mmol/L (ref 135–145)

## 2020-08-15 LAB — PHOSPHORUS: Phosphorus: 3 mg/dL (ref 2.5–4.6)

## 2020-08-15 LAB — LACTIC ACID, PLASMA
Lactic Acid, Venous: 2.5 mmol/L (ref 0.5–1.9)
Lactic Acid, Venous: 1.4 mmol/L (ref 0.5–1.9)

## 2020-08-15 LAB — TROPONIN I (HIGH SENSITIVITY)
Troponin I (High Sensitivity): 70 ng/L — ABNORMAL HIGH (ref <18)
Troponin I (High Sensitivity): 59 ng/L — ABNORMAL HIGH (ref <18)
Troponin I (High Sensitivity): 48 ng/L — ABNORMAL HIGH (ref <18)
Troponin I (High Sensitivity): 45 ng/L — ABNORMAL HIGH (ref <18)

## 2020-08-15 LAB — BRAIN NATRIURETIC PEPTIDE: B Natriuretic Peptide: 344.4 pg/mL — ABNORMAL HIGH (ref 0.0–100.0)

## 2020-08-15 LAB — AMMONIA: Ammonia: <9 umol/L — ABNORMAL LOW (ref 9–35)

## 2020-08-15 LAB — TSH: TSH: 1.618 u[IU]/mL (ref 0.350–4.500)

## 2020-08-15 LAB — MAGNESIUM: Magnesium: 2 mg/dL (ref 1.7–2.4)

## 2020-08-15 LAB — CK: Total CK: 158 U/L (ref 38–234)

## 2020-08-15 MED ORDER — POTASSIUM CHLORIDE CRYS ER 20 MEQ PO TBCR
20.0000 meq | EXTENDED_RELEASE_TABLET | Freq: Once | ORAL | Status: AC
  Administered 2020-08-15: 20 meq via ORAL
  Filled 2020-08-15: qty 1

## 2020-08-15 MED ORDER — ESCITALOPRAM OXALATE 10 MG PO TABS
10.0000 mg | ORAL_TABLET | Freq: Every day | ORAL | Status: DC
  Administered 2020-08-16 – 2020-08-19 (×3): 10 mg via ORAL
  Filled 2020-08-15 (×5): qty 1

## 2020-08-15 MED ORDER — ASPIRIN EC 81 MG PO TBEC
81.0000 mg | DELAYED_RELEASE_TABLET | Freq: Every evening | ORAL | Status: DC
  Administered 2020-08-15 – 2020-08-19 (×4): 81 mg via ORAL
  Filled 2020-08-15 (×4): qty 1

## 2020-08-15 MED ORDER — ACETAMINOPHEN 325 MG PO TABS
650.0000 mg | ORAL_TABLET | Freq: Four times a day (QID) | ORAL | Status: DC | PRN
  Administered 2020-08-16: 650 mg via ORAL
  Filled 2020-08-15: qty 2

## 2020-08-15 MED ORDER — DONEPEZIL HCL 10 MG PO TABS
10.0000 mg | ORAL_TABLET | Freq: Every day | ORAL | Status: DC
  Administered 2020-08-15: 10 mg via ORAL
  Filled 2020-08-15: qty 1

## 2020-08-15 MED ORDER — SODIUM CHLORIDE 0.9 % IV SOLN
1.0000 g | INTRAVENOUS | Status: AC
  Administered 2020-08-15 – 2020-08-17 (×3): 1 g via INTRAVENOUS
  Filled 2020-08-15 (×2): qty 1
  Filled 2020-08-15: qty 10

## 2020-08-15 MED ORDER — ONDANSETRON HCL 4 MG PO TABS: 4.0000 mg | ORAL_TABLET | Freq: Four times a day (QID) | ORAL | Status: DC | PRN

## 2020-08-15 MED ORDER — LACTATED RINGERS IV SOLN: INTRAVENOUS | Status: AC

## 2020-08-15 MED ORDER — POLYETHYLENE GLYCOL 3350 17 G PO PACK: 17.0000 g | PACK | Freq: Every day | ORAL | Status: DC | PRN

## 2020-08-15 MED ORDER — SODIUM CHLORIDE 0.9 % IV SOLN
75.0000 mL/h | INTRAVENOUS | Status: AC
  Administered 2020-08-15: 75 mL/h via INTRAVENOUS

## 2020-08-15 MED ORDER — BISACODYL 10 MG RE SUPP
10.0000 mg | Freq: Every day | RECTAL | Status: DC | PRN
  Administered 2020-08-16: 10 mg via RECTAL
  Filled 2020-08-15: qty 1

## 2020-08-15 MED ORDER — SODIUM CHLORIDE 0.9 % IV BOLUS
1000.0000 mL | Freq: Once | INTRAVENOUS | Status: AC
  Administered 2020-08-15: 1000 mL via INTRAVENOUS

## 2020-08-15 MED ORDER — PANTOPRAZOLE SODIUM 40 MG PO TBEC
40.0000 mg | DELAYED_RELEASE_TABLET | Freq: Every day | ORAL | Status: DC
  Administered 2020-08-16 – 2020-08-19 (×3): 40 mg via ORAL
  Filled 2020-08-15 (×6): qty 1

## 2020-08-15 MED ORDER — SENNA 8.6 MG PO TABS
1.0000 | ORAL_TABLET | Freq: Two times a day (BID) | ORAL | Status: DC
  Administered 2020-08-15 – 2020-08-19 (×7): 8.6 mg via ORAL
  Filled 2020-08-15 (×10): qty 1

## 2020-08-15 MED ORDER — ACETAMINOPHEN 650 MG RE SUPP
650.0000 mg | Freq: Four times a day (QID) | RECTAL | Status: DC | PRN
  Administered 2020-08-17: 650 mg via RECTAL
  Filled 2020-08-15: qty 1

## 2020-08-15 MED ORDER — ONDANSETRON HCL 4 MG/2ML IJ SOLN: 4.0000 mg | Freq: Four times a day (QID) | INTRAMUSCULAR | Status: DC | PRN

## 2020-08-15 MED ORDER — ENOXAPARIN SODIUM 30 MG/0.3ML ~~LOC~~ SOLN
30.0000 mg | SUBCUTANEOUS | Status: DC
  Administered 2020-08-15: 30 mg via SUBCUTANEOUS
  Filled 2020-08-15: qty 0.3

## 2020-08-15 MED ORDER — DONEPEZIL HCL 5 MG PO TABS
5.0000 mg | ORAL_TABLET | Freq: Every day | ORAL | Status: DC
  Administered 2020-08-16 – 2020-08-19 (×3): 5 mg via ORAL
  Filled 2020-08-15 (×4): qty 1

## 2020-08-15 MED ORDER — BUPROPION HCL ER (XL) 300 MG PO TB24
300.0000 mg | ORAL_TABLET | Freq: Every day | ORAL | Status: DC
  Administered 2020-08-16 – 2020-08-19 (×2): 300 mg via ORAL
  Filled 2020-08-15 (×5): qty 1

## 2020-08-15 NOTE — ED Provider Notes (Addendum)
April Decker Provider Note   CSN: [id number] Arrival date & time: 08/15/20  1201     History Chief Complaint  Patient presents with  . Weakness    April Decker is a 79 y.o. female.  HPI    79 year old female comes in a chief complaint of weakness. Patient has history of atrial fibrillation, hypertension and dementia.  She is coming with her husband to the ER with chief complaint of weakness and altered mental status.  According to patient's husband, patient started getting weak yesterday, however today she could not even get out of the bed.  Patient was extremely sleepy and was not responding to any stimuli.  Patient has dementia and at baseline she is not as functional as she used to be.  She still ambulates without significant discomfort and can carry minor conversations.  She is on Wellbutrin and Seroquel because of her dementia and behavioral changes, but they are not new.  No recent cough.  The booster shot was completed a month ago.  No sick contacts.  Patient never has had AMS the past and she has not had a UTI in several years per her husband.   Past Medical History:  Diagnosis Date  . A-fib ([place])   . Anxiety   . 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 Atrophic vaginitis   . Cataract   . Chronic constipation 2009  . Coccygeal fracture ([place]) 2016   close fracture, no surgery  . Depression   . GERD (gastroesophageal reflux disease)   . Hemorrhoids   . Hypertension   . Osteoporosis   . Shingles 12/15   mild case    Patient Active Problem List   Diagnosis Date Noted  . AMS (altered mental status) 08/15/2020  . 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 Alzheimer's dementia without behavioral disturbance ([place]) 10/07/2019  . Major neurocognitive disorder ([place]) 07/29/2019  . Memory loss 07/29/2019  . Medication side effect, initial encounter 03/04/2019  . Chronic anxiety 09/05/2018    . Depression 09/05/2018  . Dysphagia 09/05/2018  . Fatigue 09/05/2018  . GERD (gastroesophageal reflux disease) 09/05/2018  . Hearing impairment 09/05/2018  . IBS (irritable bowel syndrome) 09/05/2018  . Osteoarthritis 09/05/2018  . Osteopenia 09/05/2018  . Trigeminal neuralgia 09/05/2018  . Status post cataract extraction of both eyes with insertion of intraocular lens 04/26/2018  . Hyperopia with astigmatism and presbyopia, bilateral 04/27/2017  . Myopia with astigmatism and presbyopia, bilateral 04/27/2017  . Hypertension 02/18/2013  . Atrial fibrillation ([place]) 06/21/2010  . EDEMA 06/21/2010  . Benign essential hypertension 12/17/2009  . Syncope 12/17/2009  . PALPITATIONS, OCCASIONAL 12/17/2009    Past Surgical History:  Procedure Laterality Date  . TONSILLECTOMY       OB History    Gravida  1   Para  0   Term  0   Preterm      AB  1   Living  0     SAB      TAB      Ectopic      Multiple      Live Births              Family History  Problem Relation Age of Onset  . Liver cancer Mother   . Colon cancer Neg Hx   . Esophageal cancer Neg Hx   . Stomach cancer Neg Hx   . Rectal cancer Neg Hx   .  Colon polyps Neg Hx     Social History   Tobacco Use  . Smoking status: Never Smoker  . Smokeless tobacco: Never Used  Substance Use Topics  . Alcohol use: No    Alcohol/week: 0.0 standard drinks    Comment: occ glass of wine  . Drug use: No    Home Medications Prior to Admission medications   Medication Sig Start Date End Date Taking? Authorizing Provider  aspirin EC 81 MG tablet Take 81 mg by mouth every evening.    [provider]  BIOTIN PO Take 5,000 mcg by mouth every evening.     [provider]  buPROPion (WELLBUTRIN XL) 300 MG 24 hr tablet Take 1 tablet (300 mg total) by mouth daily. 04/05/20   Mozingo, Berdie Ogren, NP  CALCIUM-MAGNESIUM PO Take by mouth. 400mg -200mg  +D3    [provider]   Cholecalciferol (VITAMIN D) 2000 UNITS CAPS Take 2,000 Units by mouth every evening.     [provider]  escitalopram (LEXAPRO) 10 MG tablet Take 1 tablet (10 mg total) by mouth daily. 06/10/20   Mozingo, Berdie Ogren, NP  EVENING PRIMROSE OIL PO Take 1,300 mg by mouth every evening.     [provider]  glucosamine-chondroitin 500-400 MG tablet Take 3 tablets by mouth daily.     [provider]  olmesartan (BENICAR) 5 MG tablet Take 1 tablet by mouth daily. 05/03/17 05/03/18  [provider]  Omega-3 Fatty Acids (OMEGA 3 PO) Take 1,000 mg by mouth every evening.     [provider]  omeprazole (PRILOSEC) 20 MG capsule Take 20 mg by mouth as needed.     [provider]  valsartan (DIOVAN) 40 MG tablet Take 1 tablet (40 mg total) by mouth daily. 01/31/14   Carmin Muskrat, MD  vitamin C (ASCORBIC ACID) 500 MG tablet Take 500 mg by mouth every evening.    [provider]  zoledronic acid (RECLAST) 5 MG/100ML SOLN injection Inject 5 mg into the vein once.    [provider]    Allergies    Diltiazem, Iodides, Other, Shellfish allergy, and Iohexol  Review of Systems   Review of Systems  Constitutional: Positive for activity change.  Respiratory: Negative for shortness of breath.   Cardiovascular: Negative for chest pain.  Gastrointestinal: Negative for nausea and vomiting.  Allergic/Immunologic: Negative for immunocompromised state.  All other systems reviewed and are negative.   Physical Exam Updated Vital Signs BP (!) 135/59   Pulse 65   Temp 98.9 F (37.2 C) (Rectal)   Resp 17   Ht 5\' 6"  (1.676 m)   Wt 47.6 kg   LMP 09/19/1995   SpO2 100%   BMI 16.93 kg/m   Physical Exam Vitals and nursing note reviewed.  Constitutional:      Appearance: She is well-developed.  HENT:     Head: Normocephalic and atraumatic.  Cardiovascular:     Rate and Rhythm: Normal rate.  Pulmonary:     Effort: Pulmonary effort  is normal.  Abdominal:     General: Bowel sounds are normal.     Tenderness: There is abdominal tenderness. There is no guarding or rebound.     Comments: Generalized abdominal tenderness  Musculoskeletal:     Cervical back: Normal range of motion and neck supple.  Skin:    General: Skin is warm and dry.  Neurological:     Mental Status: She is alert and oriented to person, place, and time.  ED Results / Procedures / Treatments   Labs (all labs ordered are listed, but only abnormal results are displayed) Labs Reviewed  COMPREHENSIVE METABOLIC PANEL - Abnormal; Notable for the following components:      Result Value   Sodium 133 (*)    Potassium 3.3 (*)    Glucose, Bld 160 (*)    BUN 26 (*)    Creatinine, Ser 1.06 (*)    Total Protein 6.2 (*)    GFR, Estimated 53 (*)    All other components within normal limits  CBC WITH DIFFERENTIAL/PLATELET - Abnormal; Notable for the following components:   WBC 17.3 (*)    RBC 3.74 (*)    HCT 35.3 (*)    Neutro Abs 15.7 (*)    Lymphs Abs 0.3 (*)    Abs Immature Granulocytes 0.29 (*)    All other components within normal limits  URINALYSIS, ROUTINE W REFLEX MICROSCOPIC - Abnormal; Notable for the following components:   Hgb urine dipstick TRACE (*)    Ketones, ur 15 (*)    All other components within normal limits  LACTIC ACID, PLASMA - Abnormal; Notable for the following components:   Lactic Acid, Venous 2.5 (*)    All other components within normal limits  URINALYSIS, MICROSCOPIC (REFLEX) - Abnormal; Notable for the following components:   Bacteria, UA MANY (*)    All other components within normal limits  TROPONIN I (HIGH SENSITIVITY) - Abnormal; Notable for the following components:   Troponin I (High Sensitivity) 70 (*)    All other components within normal limits  RESP PANEL BY RT-PCR (FLU A&B, COVID) ARPGX2  URINE CULTURE  CULTURE, BLOOD (ROUTINE X 2)  CULTURE, BLOOD (ROUTINE X 2)  LACTIC ACID, PLASMA  TSH  AMMONIA    TROPONIN I (HIGH SENSITIVITY)    EKG EKG Interpretation  Date/Time:  Sunday August 15 2020 12:10:42 EST Ventricular Rate:  74 PR Interval:    QRS Duration: 103 QT Interval:  392 QTC Calculation: 435 R Axis:   -26 Text Interpretation: Sinus or ectopic atrial rhythm Borderline left axis deviation Probable anteroseptal infarct, old No acute changes No significant change since last tracing Confirmed by Varney Biles [phone]) on 08/15/2020 12:21:51 PM   Radiology DG Chest 2 View  Result Date: 08/15/2020 CLINICAL DATA:  Confusion weakness over the past 3 days. EXAM: CHEST - 2 VIEW COMPARISON:  Chest radiograph dated 01/31/2014. FINDINGS: The heart size and mediastinal contours are within normal limits. The lungs are hyperinflated, consistent with emphysema. Both lungs are clear. Degenerative changes are seen in the spine. IMPRESSION: Emphysema without acute pulmonary process. Electronically Signed   By: Zerita Boers M.D.   On: 08/15/2020 14:40   CT Head Wo Contrast  Result Date: 08/15/2020 CLINICAL DATA:  Altered mental status over the past 3 days. EXAM: CT HEAD WITHOUT CONTRAST TECHNIQUE: Contiguous axial images were obtained from the base of the skull through the vertex without intravenous contrast. COMPARISON:  CT head dated 01/31/2014. FINDINGS: Brain: No evidence of acute infarction, hemorrhage, hydrocephalus, extra-axial collection or mass lesion/mass effect. There is mild cerebral volume loss with associated ex vacuo dilatation. Periventricular white matter hypoattenuation likely represents chronic small vessel ischemic disease. Vascular: No hyperdense vessel or unexpected calcification. Skull: Normal. Negative for fracture or focal lesion. Sinuses/Orbits: No acute finding. Other: None. IMPRESSION: No acute intracranial process. Electronically Signed   By: Zerita Boers M.D.   On: 08/15/2020 14:34    Procedures Procedures (including critical care time)  Medications Ordered in  ED Medications  lactated ringers infusion ( Intravenous New Bag/Given 08/15/20 1455)  cefTRIAXone (ROCEPHIN) 1 g in sodium chloride 0.9 % 100 mL IVPB (1 g Intravenous New Bag/Given 08/15/20 1449)  sodium chloride 0.9 % bolus 1,000 mL (0 mLs Intravenous Stopped 08/15/20 1457)    ED Course  I have reviewed the triage vital signs and the nursing notes.  Pertinent labs & imaging results that were available during my care of the patient were reviewed by me and considered in my medical decision making (see chart for details).  Clinical Course as of Aug 15 1529  Sun Aug 15, 2020  1514 Positive for bacteriuria -ceftriaxone ordered.  Urinalysis, Routine w reflex microscopic Urine, Catheterized(!) [AN]  1514 Elevated white count noted.  CBC with Differential(!) [AN]  1515 CT scan of the head and chest x-ray are reassuring.  We will get CT abdomen and pelvis without contrast to ensure there is no evidence of diverticulitis.  Otherwise we will carry forward with treatment for UTI and wait to see if patient overtime improves mentally.  CT Head Wo Contrast [AN]  3729 Suspicion for meningitis remains low.  No LP for now.   [AN]    Clinical Course User Index [AN] Varney Biles, MD   MDM Rules/Calculators/A&P                          79 year old female with history of atrial fibrillation, dementia comes in a chief complaint of weakness and altered mental status.  According to patient's husband, she has been declining over the last 2 days.  Today she would not get out of the bed and was combative when he tried to stimulate her.  She was not responding to verbal stimuli or following commands. He was getting concerned about her wellbeing yesterday, but she has never gotten as sick as it is today.  There has been no fevers, no trauma and no new medications.  On exam patient has mild grimacing with abdominal exam.  No SIRS criteria at arrival.  Differential diagnosis includes UTI, diverticulitis,  electrolyte abnormality, AKI.  There is mild rigidity with extension and flexion of the upper extremity, but I doubt that she has serotonin syndrome.  Unlikely to be meningitis or viral encephalitis.  Plan is to get basic labs and reassess.  Final Clinical Impression(s) / ED Diagnoses Final diagnoses:  Acute encephalopathy    Rx / DC Orders ED Discharge Orders    None       Varney Biles, MD 08/15/20 [place], Irys Nigh, MD 08/15/20 1530

## 2020-08-15 NOTE — ED Notes (Signed)
ED Provider at bedside. 

## 2020-08-15 NOTE — H&P (Signed)
April Decker NFA:[id number] DOB: 22-Jun-1941 DOA: 08/15/2020    PCP: Haywood Pao, MD   Outpatient Specialists:  CARDS:  Dr. Sabra Heck, Cheri Fowler, MD [facility]     Patient arrived to ER on 08/15/20 at 1201 Referred by Attending Harold Hedge, MD  Patient coming from: home Lives With family at [facility].   Chief Complaint:   Chief Complaint  Patient presents with  . Weakness    HPI: April Decker is a 79 y.o. female with medical history significant of atrial fibrillation, hypertension and dementia, constipation, GERD   Presented with   For 48 h  weakness and altered mental status  She has reported abdominal pain urinary incontinence. Family reports constipation, have been using senna  Her dementia have been progressive over past 3 years Started yesterday but by today could not get out of the bed, sleepy poorly reponcive At baseline can ambulate, and have a simple conversation  Only recently started to struggle with walking for the past few weeks She is not eating well  She is falling asleep while eating  Infectious risk factors:  Reports  severe fatigue   Has  been vaccinated against COVID booster 1 m ago   Initial COVID TEST  NEGATIVE   Lab Results  Component Value Date   [place] 08/15/2020    Regarding pertinent Chronic problems:     HTN on benicar     A. Fib -  - CHA2DS2 vas score    4      Not on anticoagulation secondary to Risk of Falls,and only isolated episode of a.fib      Dementia - on seroquel   While in ER: Noted to have AKI  WBC 17 Evidence of UTI and Lactic  acid up to 2.5 Troponin mildly elevated 59 - 34 Was given IV fluid bolus with Lactica cid now down to 1.4 And started on Rocephin Transferred to [facility] was called for admission for UTI and acute encephalopathy  The following Work up has been ordered so far:  Orders Placed This Encounter  Procedures  . Urine culture  . Resp Panel by RT-PCR (Flu A&B, Covid)  Nasopharyngeal Swab  . CT Head Wo Contrast  . DG Chest 2 View  . CT ABDOMEN PELVIS WO CONTRAST  . Comprehensive metabolic panel  . CBC with Differential  . Urinalysis, Routine w reflex microscopic  . Lactic acid, plasma  . Urinalysis, Microscopic (reflex)  . TSH  . Ammonia  . Diet NPO time specified  . In and Out Cath  . Cardiac monitoring  . Cardiac monitoring  . Document height and weight  . Assess and Document Glasgow Coma Scale  . Document vital signs within 1-hour of fluid bolus completion. Notify provider of abnormal vital signs despite fluid resuscitation.  . DO NOT delay antibiotics if unable to obtain blood culture.  . Refer to Sidebar Report: Sepsis Sidebar ED/IP  . Notify provider for difficulties obtaining IV access.  . Initiate Carrier Fluid Protocol  . Cardiac monitoring  . Consult to hospitalist  ALL PATIENTS BEING ADMITTED/HAVING PROCEDURES NEED COVID-19 SCREENING  . Pulse oximetry, continuous  . EKG 12-Lead  . ED EKG  . Admit to Inpatient (patient's expected length of stay will be greater than 2 midnights or inpatient only procedure)    Following Medications were ordered in ER: Medications  lactated ringers infusion ( Intravenous New Bag/Given 08/15/20 1455)  cefTRIAXone (ROCEPHIN) 1 g in sodium  chloride 0.9 % 100 mL IVPB (0 g Intravenous Stopped 08/15/20 1538)  sodium chloride 0.9 % bolus 1,000 mL (0 mLs Intravenous Stopped 08/15/20 1457)        Consult Orders  (From admission, onward)         Start     Ordered   08/15/20 1457  Consult to hospitalist  ALL PATIENTS BEING ADMITTED/HAVING PROCEDURES NEED COVID-19 SCREENING Called [facility] and talked to ruby 3:02  Once       Comments: ALL PATIENTS BEING ADMITTED/HAVING PROCEDURES NEED COVID-19 SCREENING  Provider:  (Not yet assigned)  Question Answer Comment  Place call to: [facility]   Reason for Consult Admit      08/15/20 1456          Significant initial  Findings: Abnormal Labs  Reviewed  COMPREHENSIVE METABOLIC PANEL - Abnormal; Notable for the following components:      Result Value   Sodium 133 (*)    Potassium 3.3 (*)    Glucose, Bld 160 (*)    BUN 26 (*)    Creatinine, Ser 1.06 (*)    Total Protein 6.2 (*)    GFR, Estimated 53 (*)    All other components within normal limits  CBC WITH DIFFERENTIAL/PLATELET - Abnormal; Notable for the following components:   WBC 17.3 (*)    RBC 3.74 (*)    HCT 35.3 (*)    Neutro Abs 15.7 (*)    Lymphs Abs 0.3 (*)    Abs Immature Granulocytes 0.29 (*)    All other components within normal limits  URINALYSIS, ROUTINE W REFLEX MICROSCOPIC - Abnormal; Notable for the following components:   Hgb urine dipstick TRACE (*)    Ketones, ur 15 (*)    All other components within normal limits  LACTIC ACID, PLASMA - Abnormal; Notable for the following components:   Lactic Acid, Venous 2.5 (*)    All other components within normal limits  URINALYSIS, MICROSCOPIC (REFLEX) - Abnormal; Notable for the following components:   Bacteria, UA MANY (*)    All other components within normal limits  AMMONIA - Abnormal; Notable for the following components:   Ammonia <9 (*)    All other components within normal limits  TROPONIN I (HIGH SENSITIVITY) - Abnormal; Notable for the following components:   Troponin I (High Sensitivity) 70 (*)    All other components within normal limits  TROPONIN I (HIGH SENSITIVITY) - Abnormal; Notable for the following components:   Troponin I (High Sensitivity) 59 (*)    All other components within normal limits   Otherwise labs showing:   Recent Labs  Lab 08/15/20 1230  NA 133*  K 3.3*  CO2 24  GLUCOSE 160*  BUN 26*  CREATININE 1.06*  CALCIUM 9.2    Cr ,  Up from baseline see below Lab Results  Component Value Date   CREATININE 1.06 (H) 08/15/2020   CREATININE 0.69 01/31/2014   CREATININE 0.76 11/23/2013    Recent Labs  Lab 08/15/20 1230  AST 28  ALT 17  ALKPHOS 42  BILITOT 1.2  PROT  6.2*  ALBUMIN 3.5   Lab Results  Component Value Date   CALCIUM 9.2 08/15/2020      WBC      Component Value Date/Time   WBC 17.3 (H) 08/15/2020 1230   LYMPHSABS 0.3 (L) 08/15/2020 1230   MONOABS 1.0 08/15/2020 1230   EOSABS 0.0 08/15/2020 1230   BASOSABS 0.0 08/15/2020 1230   Plt: Lab Results  Component Value Date   PLT 187 08/15/2020   Lactic Acid, Venous    Component Value Date/Time   LATICACIDVEN 1.4 08/15/2020 1523    Procalcitonin   Ordered   COVID-19 Labs  No results for input(s): DDIMER, FERRITIN, LDH, CRP in the last 72 hours.  Lab Results  Component Value Date   [place] NEGATIVE 08/15/2020     HG/HCT   stable,       Component Value Date/Time   HGB 12.1 08/15/2020 1230   HCT 35.3 (L) 08/15/2020 1230   MCV 94.4 08/15/2020 1230    No results for input(s): LIPASE, AMYLASE in the last 168 hours. Recent Labs  Lab 08/15/20 1543  AMMONIA <9*    Troponin 59 -70 Cardiac Panel (last 3 results) No results for input(s): CKTOTAL, CKMB, TROPONINI, RELINDX in the last 72 hours.     ECG: Ordered Personally reviewed by me showing: HR : 74 Rhythm: NSR,    nonspecific changes,   QTC 435   UA   evidence of UTI   Urine analysis:    Component Value Date/Time   COLORURINE YELLOW 08/15/2020 [place] 08/15/2020 1317   LABSPEC 1.020 08/15/2020 1317   PHURINE 8.0 08/15/2020 1317   GLUCOSEU NEGATIVE 08/15/2020 1317   HGBUR TRACE (A) 08/15/2020 1317   BILIRUBINUR NEGATIVE 08/15/2020 1317   BILIRUBINUR - 10/15/2013 1548   KETONESUR 15 (A) 08/15/2020 1317   PROTEINUR NEGATIVE 08/15/2020 1317   UROBILINOGEN 0.2 01/31/2014 2120   NITRITE NEGATIVE 08/15/2020 1317   LEUKOCYTESUR NEGATIVE 08/15/2020 1317    Ordered  CT HEAD   NON acute  CXR - COPD?  CTabd/pelvis -  Non-acute +constipation    ED Triage Vitals  Enc Vitals Group     BP 08/15/20 1211 (!) 115/59     Pulse Rate 08/15/20 1211 74     Resp 08/15/20 1211 18     Temp 08/15/20  1204 99.2 F (37.3 C)     Temp Source 08/15/20 1204 Oral     SpO2 08/15/20 1211 97 %     Weight 08/15/20 1205 104 lb 14.4 oz (47.6 kg)     Height 08/15/20 1212 5\' 6"  (1.676 m)     Head Circumference --      Peak Flow --      Pain Score --      Pain Loc --      Pain Edu? --      Excl. in [place]? --   TMAX(24)@       Latest  Blood pressure (!) 150/74, pulse 65, temperature 98 F (36.7 C), temperature source Oral, resp. rate 18, height 5\' 6"  (1.676 m), weight 47.6 kg, last menstrual period 09/19/1995, SpO2 99 %.    Review of Systems:    Pertinent positives include:  , fatigue , abdominal pain Constitutional:  No weight loss, night sweats, Fevers, chills, weight loss  HEENT:  No headaches, Difficulty swallowing,Tooth/dental problems,Sore throat,  No sneezing, itching, ear ache, nasal congestion, post nasal drip,  Cardio-vascular:  No chest pain, Orthopnea, PND, anasarca, dizziness, palpitations.no Bilateral lower extremity swelling  GI:  No heartburn, indigestion, nausea, vomiting, diarrhea, change in bowel habits, loss of appetite, melena, blood in stool, hematemesis Resp:  no shortness of breath at rest. No dyspnea on exertion, No excess mucus, no productive cough, No non-productive cough, No coughing up of blood.No change in color of mucus.No wheezing. Skin:  no rash or lesions. No jaundice GU:  no dysuria, change  in color of urine, no urgency or frequency. No straining to urinate.  No flank pain.  Musculoskeletal:  No joint pain or no joint swelling. No decreased range of motion. No back pain.  Psych:  No change in mood or affect. No depression or anxiety. No memory loss.  Neuro: no localizing neurological complaints, no tingling, no weakness, no double vision, no gait abnormality, no slurred speech, no confusion  All systems reviewed and apart from [place] all are negative  Past Medical History:   Past Medical History:  Diagnosis Date  . A-fib ([place])   . Anxiety   .  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 Atrophic vaginitis   . Cataract   . Chronic constipation 2009  . Coccygeal fracture ([place]) 2016   close fracture, no surgery  . Depression   . GERD (gastroesophageal reflux disease)   . Hemorrhoids   . Hypertension   . Osteoporosis   . Shingles 12/15   mild case     Past Surgical History:  Procedure Laterality Date  . TONSILLECTOMY      Social History:  Ambulatory   independently       reports that she has never smoked. She has never used smokeless tobacco. She reports that she does not drink alcohol and does not use drugs.  Family History:   Family History  Problem Relation Age of Onset  . Liver cancer Mother   . Colon cancer Neg Hx   . Esophageal cancer Neg Hx   . Stomach cancer Neg Hx   . Rectal cancer Neg Hx   . Colon polyps Neg Hx     Allergies: Allergies  Allergen Reactions  . Diltiazem Swelling    Other reaction(s): SWELLING/EDEMA Other reaction(s): SWELLING/EDEMA Other reaction(s): SWELLING/EDEMA  . Iodides Anaphylaxis  . Other Anaphylaxis and Other (See Comments)    X-Ray Dye--brain stem  Other reaction(s): ANAPHYLAXIS X-Ray Dye--brain stem  X-Ray Dye--brain stem   . Shellfish Allergy Hives  . Iohexol Other (See Comments)     Code: HIVES   Code: HIVES  Code: HIVES     Prior to Admission medications   Medication Sig Start Date End Date Taking? Authorizing Provider  aspirin EC 81 MG tablet Take 81 mg by mouth every evening.    [provider]  BIOTIN PO Take 5,000 mcg by mouth every evening.     [provider]  buPROPion (WELLBUTRIN XL) 300 MG 24 hr tablet Take 1 tablet (300 mg total) by mouth daily. 04/05/20   Mozingo, Berdie Ogren, NP  CALCIUM-MAGNESIUM PO Take by mouth. 400mg -200mg  +D3    [provider]  Cholecalciferol (VITAMIN D) 2000 UNITS CAPS Take 2,000 Units by mouth every evening.     [provider]  escitalopram (LEXAPRO) 10 MG tablet Take 1 tablet (10 mg total)  by mouth daily. 06/10/20   Mozingo, Berdie Ogren, NP  EVENING PRIMROSE OIL PO Take 1,300 mg by mouth every evening.     [provider]  glucosamine-chondroitin 500-400 MG tablet Take 3 tablets by mouth daily.     [provider]  olmesartan (BENICAR) 5 MG tablet Take 1 tablet by mouth daily. 05/03/17 05/03/18  [provider]  Omega-3 Fatty Acids (OMEGA 3 PO) Take 1,000 mg by mouth every evening.     [provider]  omeprazole (PRILOSEC) 20 MG capsule Take 20 mg by mouth as needed.     [provider]  valsartan (DIOVAN) 40 MG tablet Take  1 tablet (40 mg total) by mouth daily. 01/31/14   Carmin Muskrat, MD  vitamin C (ASCORBIC ACID) 500 MG tablet Take 500 mg by mouth every evening.    [provider]  zoledronic acid (RECLAST) 5 MG/100ML SOLN injection Inject 5 mg into the vein once.    [provider]   Physical Exam: Vitals with BMI 08/15/2020 08/15/2020 08/15/2020  Height - - -  Weight - - -  BMI - - -  Systolic 270 350 093  Diastolic 74 77 69  Pulse 65 66 66    1. General:  in No  Acute distress    Chronically ill  -appearing 2. Psychological: Alert and Oriented 3. Head/ENT:    Dry Mucous Membranes                          Head Non traumatic, neck supple                            Poor Dentition 4. SKIN:   decreased Skin turgor,  Skin clean Dry and intact no rash 5. Heart: Regular rate and rhythm no  Murmur, no Rub or gallop 6. Lungs:  , no wheezes or crackles   7. Abdomen: Soft, non-tender, Non distended bowel sounds present 8. Lower extremities: no clubbing, cyanosis, no edema 9. Neurologically Grossly intact, moving all 4 extremities equally  10. MSK: Normal range of motion  All other LABS:   Recent Labs  Lab 08/15/20 1230  WBC 17.3*  NEUTROABS 15.7*  HGB 12.1  HCT 35.3*  MCV 94.4  PLT 187     Recent Labs  Lab 08/15/20 1230  NA 133*  K 3.3*  CL 101  CO2 24  GLUCOSE 160*  BUN 26*  CREATININE  1.06*  CALCIUM 9.2     Recent Labs  Lab 08/15/20 1230  AST 28  ALT 17  ALKPHOS 42  BILITOT 1.2  PROT 6.2*  ALBUMIN 3.5       Cultures:    Component Value Date/Time   SDES URINE, RANDOM 06/03/2010 1015   SPECREQUEST NONE 06/03/2010 1015   CULT NO GROWTH 06/03/2010 1015   REPTSTATUS 06/04/2010 FINAL 06/03/2010 1015     Radiological Exams on Admission: CT ABDOMEN PELVIS WO CONTRAST  Result Date: 08/15/2020 CLINICAL DATA:  Abdominal pain.  Diverticulitis suspected. EXAM: CT ABDOMEN AND PELVIS WITHOUT CONTRAST TECHNIQUE: Multidetector CT imaging of the abdomen and pelvis was performed following the standard protocol without IV contrast. COMPARISON:  Jan 20, 2019 FINDINGS: Lower chest: Minimal bilateral left greater than right pleural thickening versus effusion. Hepatobiliary: No focal liver abnormality is seen. No gallstones, gallbladder wall thickening, or biliary dilatation. Pancreas: Unremarkable. No pancreatic ductal dilatation or surrounding inflammatory changes. Spleen: Normal in size without focal abnormality. Adrenals/Urinary Tract: Adrenal glands are unremarkable. Kidneys are normal, without renal calculi, focal lesion, or hydronephrosis. Bladder is unremarkable. Stomach/Bowel: Stomach is within normal limits. No evidence of appendicitis. No evidence of bowel wall thickening, distention, or inflammatory changes. No evidence of diverticulosis. Moderate amount of formed stool throughout the colon. Vascular/Lymphatic: Aortic atherosclerosis. No enlarged abdominal or pelvic lymph nodes. Reproductive: Atrophic or surgically absent uterus. No adnexal masses. Other: No abdominal wall hernia or abnormality. No abdominopelvic ascites. Musculoskeletal: No acute osseous findings. Mild spondylosis of the lumbosacral spine. IMPRESSION: 1. No evidence of acute abnormalities within the abdomen or pelvis. 2. Moderate amount of formed stool throughout  the colon, consistent with constipation. 3.  Minimal bilateral left greater than right pleural thickening versus effusion. 4. Aortic atherosclerosis. Aortic Atherosclerosis (ICD10-I70.0). Electronically Signed   By: Fidela Salisbury M.D.   On: 08/15/2020 16:44   DG Chest 2 View  Result Date: 08/15/2020 CLINICAL DATA:  Confusion weakness over the past 3 days. EXAM: CHEST - 2 VIEW COMPARISON:  Chest radiograph dated 01/31/2014. FINDINGS: The heart size and mediastinal contours are within normal limits. The lungs are hyperinflated, consistent with emphysema. Both lungs are clear. Degenerative changes are seen in the spine. IMPRESSION: Emphysema without acute pulmonary process. Electronically Signed   By: Zerita Boers M.D.   On: 08/15/2020 14:40   CT Head Wo Contrast  Result Date: 08/15/2020 CLINICAL DATA:  Altered mental status over the past 3 days. EXAM: CT HEAD WITHOUT CONTRAST TECHNIQUE: Contiguous axial images were obtained from the base of the skull through the vertex without intravenous contrast. COMPARISON:  CT head dated 01/31/2014. FINDINGS: Brain: No evidence of acute infarction, hemorrhage, hydrocephalus, extra-axial collection or mass lesion/mass effect. There is mild cerebral volume loss with associated ex vacuo dilatation. Periventricular white matter hypoattenuation likely represents chronic small vessel ischemic disease. Vascular: No hyperdense vessel or unexpected calcification. Skull: Normal. Negative for fracture or focal lesion. Sinuses/Orbits: No acute finding. Other: None. IMPRESSION: No acute intracranial process. Electronically Signed   By: Zerita Boers M.D.   On: 08/15/2020 14:34    Chart has been reviewed    Assessment/Plan  79 y.o. female with medical history significant of atrial fibrillation, hypertension and dementia, constipation, GERD  Admitted for AKI, UTI  Present on Admission: . AMS (altered mental status) -   - most likely multifactorial secondary to combination of  infection  mild dehydration  secondary to decreased by mouth intake,    - Will rehydrate   - treat underlining infection     - if no improvement may need further imaging to evaluate for CNS pathology pathology such as MRI of the brain, patient doing better after IV fluid resusitaiton  - neurological exam appears to be nonfocal but patient unable to cooperate fully    - no history of liver disease ammonia unremarkable  . Hypertension - per family pt no-longer takes any antihypertensive medications we will continue to monitor  . GERD (gastroesophageal reflux disease) -chronic stable continue home medications   atrial fibrillation (HCC) -was an isolated event patient is on baby aspirin not on  any  anticoagulation high risk of falls Rate controlled in sinus currently  . Alzheimer's dementia without behavioral disturbance ([place]) continue home meds patient is on Aricept and Wellbutrin  Patient may need placement although husband hopes to take her home  . AKI (acute kidney injury) ([place]) -in the setting of illness decreased p.o. intake will rehydrate and follow  Hypokalemia will replace as needed    . Dehydration initially elevated lactic acid patient not meeting SIRS criteria Rehydrated after that lactic acid has   decreased continue to monitor Continue to rehydrate   UTI-  - treat with Rocephin        await results of urine culture and adjust antibiotic coverage as needed    Other plan as per orders.  DVT prophylaxis:    Lovenox       Code Status:    Code Status: Not on file   DNR/DNI as per  family  I had personally discussed CODE STATUS with  Family    Family Communication:   Family not at  Bedside  plan of care was discussed on the phone with   Husband   Disposition Plan:     likely will need placement for rehabilitation                             Following barriers for discharge:                            Electrolytes corrected                                                         white count  improving able to transition to PO antibiotics                             Will need to be able to tolerate PO                                                  Would benefit from PT/OT eval prior to DC  Ordered                   Swallow eval - SLP ordered                                      Transition of care consulted                   Nutrition    consulted                                      Consults called: none    Admission status:  ED Disposition    ED Disposition Condition [place]: [facility] [[id number]]  Level of Care: Telemetry [5]  Admit to tele based on following criteria: Monitor for Ischemic changes  May admit patient to [facility] or [facility] if equivalent level of care is available:: Yes  Covid Evaluation: Confirmed COVID Negative  Diagnosis: AMS (altered mental status) [[id number]]  Admitting Physician: Harold Hedge [[id number]]  Attending Physician: Harold Hedge [[id number]]  Estimated length of stay: past midnight tomorrow  Certification:: I certify this patient will need inpatient services for at least 2 midnights          inpatient     I Expect 2 midnight stay secondary to severity of patient's current illness need for inpatient interventions justified by the following:  hemodynamic instability despite optimal treatment (tachycardia )  Severe lab/radiological/exam abnormalities including:     and extensive comorbidities including:  dementia That are currently affecting medical management.   I expect  patient to be hospitalized for 2 midnights requiring inpatient medical care.  Patient is at high risk for adverse outcome (such as loss of life or disability) if not treated.  Indication for inpatient stay as follows:  Severe change from baseline regarding  mental status    Need for IV antibiotics, IV fluids     Level of care    tele  For 12H    Lab Results  Component Value Date   [place] NEGATIVE  08/15/2020     Precautions: admitted as  Covid Negative     PPE: Used by the provider:   P100  eye Goggles,  Gloves     Jermery Caratachea 08/15/2020, 10:05 PM    [facility]     after 2 AM please page floor coverage PA If 7AM-7PM, please contact the day team taking care of the patient using [email or web address]   Patient was evaluated in the context of the global COVID-19 pandemic, which necessitated consideration that the patient might be at risk for infection with the SARS-CoV-2 virus that causes COVID-19. Institutional protocols and algorithms that pertain to the evaluation of patients at risk for COVID-19 are in a state of rapid change based on information released by regulatory bodies including the CDC and federal and state organizations. These policies and algorithms were followed during the patient's care.

## 2020-08-15 NOTE — ED Triage Notes (Signed)
Pt arrives via EMS who states she has been increasingly confused and weak over the past 3 days. Pt also having urinary incontinence. Pt denies pain at arriavl, hx of advancing dementia over the past year. Has been complaining of abdominal pain.

## 2020-08-15 NOTE — ED Notes (Signed)
Patient transported to CT and xray 

## 2020-08-15 NOTE — ED Notes (Signed)
Attempt x 2 to obtain 2nd blood culture unsuccessful. EDP made aware and states okay for one set

## 2020-08-16 DIAGNOSIS — G301 Alzheimer's disease with late onset: Secondary | ICD-10-CM

## 2020-08-16 DIAGNOSIS — R400 Somnolence: Secondary | ICD-10-CM

## 2020-08-16 LAB — GLUCOSE, CAPILLARY
Glucose-Capillary: 58 mg/dL — ABNORMAL LOW (ref 70–99)
Glucose-Capillary: 69 mg/dL — ABNORMAL LOW (ref 70–99)
Glucose-Capillary: 98 mg/dL (ref 70–99)
Glucose-Capillary: 81 mg/dL (ref 70–99)
Glucose-Capillary: 75 mg/dL (ref 70–99)
Glucose-Capillary: 152 mg/dL — ABNORMAL HIGH (ref 70–99)

## 2020-08-16 LAB — COMPREHENSIVE METABOLIC PANEL
ALT: 31 U/L (ref 0–44)
AST: 41 U/L (ref 15–41)
Albumin: 3.8 g/dL (ref 3.5–5.0)
Alkaline Phosphatase: 49 U/L (ref 38–126)
Anion gap: 11 (ref 5–15)
BUN: 20 mg/dL (ref 8–23)
CO2: 23 mmol/L (ref 22–32)
Calcium: 9.2 mg/dL (ref 8.9–10.3)
Chloride: 102 mmol/L (ref 98–111)
Creatinine, Ser: 0.82 mg/dL (ref 0.44–1.00)
GFR, Estimated: >60 mL/min (ref >60)
Glucose, Bld: 137 mg/dL — ABNORMAL HIGH (ref 70–99)
Potassium: 3.4 mmol/L — ABNORMAL LOW (ref 3.5–5.1)
Sodium: 136 mmol/L (ref 135–145)
Total Bilirubin: 0.6 mg/dL (ref 0.3–1.2)
Total Protein: 6.7 g/dL (ref 6.5–8.1)

## 2020-08-16 LAB — CBC WITH DIFFERENTIAL/PLATELET
Abs Immature Granulocytes: 0.04 10*3/uL (ref 0.00–0.07)
Basophils Absolute: 0 10*3/uL (ref 0.0–0.1)
Basophils Relative: 0 %
Eosinophils Absolute: 0 10*3/uL (ref 0.0–0.5)
Eosinophils Relative: 0 %
HCT: 37.5 % (ref 36.0–46.0)
Hemoglobin: 12.4 g/dL (ref 12.0–15.0)
Immature Granulocytes: 0 %
Lymphocytes Relative: 5 %
Lymphs Abs: 0.6 10*3/uL — ABNORMAL LOW (ref 0.7–4.0)
MCH: 32.1 pg (ref 26.0–34.0)
MCHC: 33.1 g/dL (ref 30.0–36.0)
MCV: 97.2 fL (ref 80.0–100.0)
Monocytes Absolute: 0.7 10*3/uL (ref 0.1–1.0)
Monocytes Relative: 7 %
Neutro Abs: 9.3 10*3/uL — ABNORMAL HIGH (ref 1.7–7.7)
Neutrophils Relative %: 88 %
Platelets: 174 10*3/uL (ref 150–400)
RBC: 3.86 MIL/uL — ABNORMAL LOW (ref 3.87–5.11)
RDW: 13.1 % (ref 11.5–15.5)
WBC: 10.7 10*3/uL — ABNORMAL HIGH (ref 4.0–10.5)
nRBC: 0 % (ref 0.0–0.2)

## 2020-08-16 LAB — URINE CULTURE: Culture: NO GROWTH

## 2020-08-16 LAB — PHOSPHORUS: Phosphorus: 1.5 mg/dL — ABNORMAL LOW (ref 2.5–4.6)

## 2020-08-16 LAB — TSH: TSH: 3.832 u[IU]/mL (ref 0.350–4.500)

## 2020-08-16 LAB — PREALBUMIN: Prealbumin: 12.2 mg/dL — ABNORMAL LOW (ref 18–38)

## 2020-08-16 LAB — VITAMIN B12: Vitamin B-12: 418 pg/mL (ref 180–914)

## 2020-08-16 LAB — MAGNESIUM: Magnesium: 2.1 mg/dL (ref 1.7–2.4)

## 2020-08-16 LAB — FOLATE: Folate: 9.1 ng/mL (ref >5.9)

## 2020-08-16 MED ORDER — POTASSIUM PHOSPHATES 15 MMOLE/5ML IV SOLN
30.0000 mmol | Freq: Once | INTRAVENOUS | Status: AC
  Administered 2020-08-16: 30 mmol via INTRAVENOUS
  Filled 2020-08-16: qty 10

## 2020-08-16 MED ORDER — ENOXAPARIN SODIUM 40 MG/0.4ML ~~LOC~~ SOLN
40.0000 mg | SUBCUTANEOUS | Status: DC
  Administered 2020-08-16: 40 mg via SUBCUTANEOUS
  Filled 2020-08-16: qty 0.4

## 2020-08-16 MED ORDER — LORAZEPAM 2 MG/ML IJ SOLN
0.5000 mg | INTRAMUSCULAR | Status: DC | PRN
  Administered 2020-08-16: 0.5 mg via INTRAVENOUS
  Filled 2020-08-16: qty 1

## 2020-08-16 MED ORDER — HALOPERIDOL 5 MG PO TABS
5.0000 mg | ORAL_TABLET | Freq: Four times a day (QID) | ORAL | Status: DC | PRN
  Administered 2020-08-16: 5 mg via ORAL
  Filled 2020-08-16 (×2): qty 1

## 2020-08-16 MED ORDER — POTASSIUM PHOSPHATES 15 MMOLE/5ML IV SOLN
30.0000 mmol | Freq: Once | INTRAVENOUS | Status: DC
  Filled 2020-08-16: qty 10

## 2020-08-16 MED ORDER — QUETIAPINE FUMARATE 25 MG PO TABS
25.0000 mg | ORAL_TABLET | Freq: Every day | ORAL | Status: DC
  Administered 2020-08-16 – 2020-08-19 (×3): 25 mg via ORAL
  Filled 2020-08-16 (×4): qty 1

## 2020-08-16 MED ORDER — LORAZEPAM 2 MG/ML IJ SOLN
0.5000 mg | Freq: Once | INTRAMUSCULAR | Status: AC
  Administered 2020-08-16: 0.5 mg via INTRAVENOUS
  Filled 2020-08-16: qty 1

## 2020-08-16 NOTE — Progress Notes (Signed)
Hypoglycemic Event  CBG: 75  Treatment: 8 oz juice/soda  Symptoms: None   Follow-up CBG: [id number] CBG Result:152  Possible Reasons for Event: inadequate PO intake  Paged MD to inform      Ellsie Violette, Laurel Dimmer

## 2020-08-16 NOTE — Evaluation (Signed)
Clinical/Bedside Swallow Evaluation Patient Details  Name: VICY MEDICO MRN: [id number] Date of Birth: 1940-12-12  Today's Date: 08/16/2020 Time: SLP Start Time (ACUTE ONLY): 0935 SLP Stop Time (ACUTE ONLY): 1005 SLP Time Calculation (min) (ACUTE ONLY): 30 min  Past Medical History:  Past Medical History:  Diagnosis Date  . A-fib ([place])   . Anxiety   . 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 Atrophic vaginitis   . Cataract   . Chronic constipation 2009  . Coccygeal fracture ([place]) 2016   close fracture, no surgery  . Depression   . GERD (gastroesophageal reflux disease)   . Hemorrhoids   . Hypertension   . Osteoporosis   . Shingles 12/15   mild case   Past Surgical History:  Past Surgical History:  Procedure Laterality Date  . TONSILLECTOMY     HPI:  Patient is an 79 y.o. female with PMH: atrial fibrillation, HTN, dementia, constipation, GERD. She presented from [facility] [facility]) with her husband. (Husband stated that he does all her care and that he has been hiding her dementia from the community). She presented to hospital with 48 hours of weakness and AMS, abdominal pain urinary incontinence. At baseline she can ambulate and have simple conversation but in recent weeks has struggled to walk, is not eating well and is having difficulty maintaining alertness. ER testing revealed AKI, evidence of UTI. She has been having hypoglycemic events in hospital since admission.   Assessment / Plan / Recommendation Clinical Impression  Patient presents with a mild oral dysphagia with impact from cogntion (h/o dementia and per husband report seems to be progressing in past few weeks). She demonstrated mild oral delays with mastication of regular solids, WFL with puree solids and thin liquids. She did not exhibited any overt coughing, throat clearing, noticable change in breathing and voice remained clear throughout. Patient would benefit from at least one SLP treatment session to  focus on diet toleration and family education as needed. SLP Visit Diagnosis: Dysphagia, unspecified (R13.10)    Aspiration Risk  Mild aspiration risk    Diet Recommendation Regular;Thin liquid   Liquid Administration via: Cup;Straw Medication Administration: Whole meds with puree Supervision: Patient able to self feed;Full supervision/cueing for compensatory strategies Compensations: Minimize environmental distractions;Slow rate;Small sips/bites Postural Changes: Seated upright at 90 degrees    Other  Recommendations Oral Care Recommendations: Oral care BID   Follow up Recommendations None      Frequency and Duration min 1 x/week  1 week       Prognosis Prognosis for Safe Diet Advancement: Good Barriers to Reach Goals: Cognitive deficits      Swallow Study   General Date of Onset: 08/15/20 HPI: Patient is an 79 y.o. female with PMH: atrial fibrillation, HTN, dementia, constipation, GERD. She presented from [facility]) with her husband. (Husband stated that he does all her care and that he has been hiding her dementia from the community). She presented to hospital with 48 hours of weakness and AMS, abdominal pain urinary incontinence. At baseline she can ambulate and have simple conversation but in recent weeks has struggled to walk, is not eating well and is having difficulty maintaining alertness. ER testing revealed AKI, evidence of UTI. She has been having hypoglycemic events in hospital since admission. Type of Study: Bedside Swallow Evaluation Previous Swallow Assessment: None Diet Prior to this Study: Regular;Thin liquids Temperature Spikes Noted: No Respiratory Status: Room air History of Recent Intubation: No Behavior/Cognition: Alert;Pleasant mood;Cooperative;Confused;Distractible  Oral Cavity Assessment: Within Functional Limits Oral Care Completed by SLP: Yes Oral Cavity - Dentition: Adequate natural dentition Vision: Functional for  self-feeding Self-Feeding Abilities: Able to feed self;Needs set up Patient Positioning: Upright in bed;Other (comment) (sitting EOB with PT and PT tech helped with her intake and abiliy to feed self, attend to meal) Baseline Vocal Quality: Normal;Low vocal intensity Volitional Cough: Cognitively unable to elicit Volitional Swallow: Unable to elicit    Oral/Motor/Sensory Function Overall Oral Motor/Sensory Function: Within functional limits   Ice Chips     Thin Liquid Thin Liquid: Within functional limits Presentation: Cup;Self Fed    Nectar Thick     Honey Thick     Puree Puree: Within functional limits   Solid     Solid: Impaired Oral Phase Impairments: Impaired mastication Other Comments: mild delay in mastication but overall good oral transit and clearance of oral cavity      Sonia Baller, MA, CCC-SLP Speech Therapy

## 2020-08-16 NOTE — Evaluation (Signed)
Physical Therapy Evaluation Patient Details Name: April Decker MRN: [id number] DOB: 24-Feb-1941 Today's Date: 08/16/2020   History of Present Illness  Patient is an 79 y.o. female with PMH: atrial fibrillation, HTN, dementia, constipation, GERD. She presented from [facility]) with her husband. (Husband stated that he does all her care and that he has been hiding her dementia from the community). She presented to hospital with 48 hours of weakness and AMS, abdominal pain urinary incontinence.  Pt's husband relays 3 months of gradual decline and 5 days of rapid decline.  Clinical Impression  Pt admitted with above diagnosis. Pt currently with functional limitations due to the deficits listed below (see PT Problem List). Pt will benefit from skilled PT to increase their independence and safety with mobility to allow discharge to the venue listed below.  Pt finishing with SLP upon arrival and SLP very agreeable to see pt EOB and eating.  Pt assisted to EOB and able to perform self feeding with prompting.  OT arrived to room during session.  Pt assisted with standing and transferring to recliner.  Pt with posterior lean and not lifting feet for transfer so deferred ambulating today.  Spouse reports gradual decline in mobility and cognition the past couple months however sharp decline the past 4 days leading to admission.  Pt agreeable to rehab as he was having great difficultly managing patient at [place].     Follow Up Recommendations SNF;Supervision/Assistance - 24 hour    Equipment Recommendations  Rolling walker with 5" wheels    Recommendations for Other Services       Precautions / Restrictions Precautions Precautions: Fall Restrictions Weight Bearing Restrictions: No      Mobility  Bed Mobility Overal bed mobility: Needs Assistance Bed Mobility: Supine to Sit     Supine to sit: HOB elevated;Min assist     General bed mobility comments: min assist to guide pt  to EOB    Transfers Overall transfer level: Needs assistance Equipment used: 2 person hand held assist Transfers: Sit to/from Stand;Stand Pivot Transfers Sit to Stand: +2 physical assistance;From elevated surface;Mod assist Stand pivot transfers: +2 physical assistance;Mod assist       General transfer comment: multimodal cues for technique, pt with posterior lean initially, difficulty lifting feet to take steps and shuffled feet on floor to move over to recliner  Ambulation/Gait                Stairs            Wheelchair Mobility    Modified Rankin (Stroke Patients Only)       Balance Overall balance assessment: Needs assistance Sitting-balance support: Feet unsupported;No upper extremity supported Sitting balance-Leahy Scale: Poor   Postural control: Posterior lean Standing balance support: Bilateral upper extremity supported Standing balance-Leahy Scale: Poor Standing balance comment: Required BUE support with assist of 2.                             Pertinent Vitals/Pain Pain Assessment: Faces Faces Pain Scale: Hurts a little bit Pain Location: pt unable to state Pain Descriptors / Indicators: Discomfort;Grimacing Pain Intervention(s): Limited activity within patient's tolerance;Monitored during session;Repositioned    Home Living Family/patient expects to be discharged to:: Skilled nursing facility Living Arrangements: Spouse/significant other   Type of Home: Independent living facility         Home Equipment: Shower seat      Prior Function Level of  Independence: Needs assistance   Gait / Transfers Assistance Needed: pt ambulatory without assistive device, spouse reports decline in function over the past couple months however steady fast decline over 4-5 days leading to admission.  ADL's / Homemaking Assistance Needed: Prior to last week, husband reports that pt performed all her ADLs independently, then suddenly with last few  weeks, pt was unable to don her bra and began to require incerasing assistance with all self care. .        Hand Dominance   Dominant Hand: Right    Extremity/Trunk Assessment   Upper Extremity Assessment Upper Extremity Assessment: Generalized weakness (Worsening UE tremor per husband report)    Lower Extremity Assessment Lower Extremity Assessment: Generalized weakness    Cervical / Trunk Assessment Cervical / Trunk Assessment: Normal  Communication   Communication: No difficulties  Cognition Arousal/Alertness: Awake/alert Behavior During Therapy: Flat affect Overall Cognitive Status: Impaired/Different from baseline Area of Impairment: Orientation;Following commands;Safety/judgement;Awareness;Problem solving;Memory;Attention                 Orientation Level: Disoriented to;Place;Time;Situation Current Attention Level: Focused Memory: Decreased recall of precautions;Decreased short-term memory Following Commands: Follows one step commands inconsistently;Follows one step commands with increased time Safety/Judgement: Decreased awareness of safety;Decreased awareness of deficits   Problem Solving: Slow processing;Difficulty sequencing;Requires verbal cues;Requires tactile cues;Decreased initiation General Comments: spouse reports signficant decline in cognition a few days prior to admission, pt able to follow simple commands with increased time.  At baseline, can carry on simple conversations perc chart.  Presented with wording finding difficulty and word salad today      General Comments      Exercises     Assessment/Plan    PT Assessment Patient needs continued PT services  PT Problem List Decreased strength;Decreased mobility;Decreased balance;Decreased knowledge of use of DME;Decreased activity tolerance;Decreased cognition;Decreased safety awareness       PT Treatment Interventions DME instruction;Therapeutic exercise;Gait training;Balance  training;Therapeutic activities;Patient/family education;Functional mobility training    PT Goals (Current goals can be found in the Care Plan section)  Acute Rehab PT Goals Patient Stated Goal: Per spouse: To be able to resume safely caring for pt at home. PT Goal Formulation: With patient/family Time For Goal Achievement: 08/30/20 Potential to Achieve Goals: Fair    Frequency Min 2X/week   Barriers to discharge        Co-evaluation PT/OT/SLP Co-Evaluation/Treatment: Yes Reason for Co-Treatment: For patient/therapist safety;To address functional/ADL transfers PT goals addressed during session: Mobility/safety with mobility OT goals addressed during session: ADL's and self-care SLP goals addressed during session: Swallowing;Cognition     AM-PAC PT "6 Clicks" Mobility  Outcome Measure Help needed turning from your back to your side while in a flat bed without using bedrails?: A Lot Help needed moving from lying on your back to sitting on the side of a flat bed without using bedrails?: A Lot Help needed moving to and from a bed to a chair (including a wheelchair)?: A Lot Help needed standing up from a chair using your arms (e.g., wheelchair or bedside chair)?: A Lot Help needed to walk in hospital room?: A Lot Help needed climbing 3-5 steps with a railing? : Total 6 Click Score: 11    End of Session Equipment Utilized During Treatment: Gait belt Activity Tolerance: Patient tolerated treatment well Patient left: in chair;with call bell/phone within reach;with chair alarm set;with family/visitor present Nurse Communication: Mobility status PT Visit Diagnosis: Difficulty in walking, not elsewhere classified (R26.2);Unsteadiness on feet (R26.81)  Time: 1068-1661 PT Time Calculation (min) (ACUTE ONLY): 24 min   Charges:   PT Evaluation $PT Eval Moderate Complexity: 1 Mod         Kati PT, DPT Acute Rehabilitation Services Pager: [phone] Office:  [phone]  Trena Platt 08/16/2020, 12:38 PM

## 2020-08-16 NOTE — Evaluation (Signed)
Occupational Therapy Evaluation Patient Details Name: April Decker MRN: [id number] DOB: [birth year] Today's Date: 08/16/2020    History of Present Illness Patient is an 79 y.o. female with PMH: atrial fibrillation, HTN, dementia, constipation, GERD. She presented from [place] [facility]) with her husband. (Husband stated that he does all her care and that he has been hiding her dementia from the community). She presented to hospital with 48 hours of weakness and AMS, abdominal pain urinary incontinence.  Pt's husband relays 3 months of gradual decline and 5 days of rapid decline.   Clinical Impression   An occupational therapy evaluation was completed on this 79 year old, right handed,  female . Patient is currently requiring assistance with ADLs including minimal assist for feeding in supported position due to poor sitting balance, moderate assist for UE dressing and grooming, maximum assist for bathing, and total assist for LE dressing and toileting all of which is below patient's typical baseline of being Independent with functional mobility and ADLs under the supervision of pt's spouse.  During this evaluation, patient was limited by confusion, somnolence, generalized weakness, UE tremor and decreased activity tolerance, which has the potential to impact patient's safety and independence during functional mobility, as well as performance for ADLs. [place] "6-clicks" Daily Activity Inpatient Short Form score of 11/24 indicates 70.42% ADL impairment this session. Patient lives with her husband, who is able to provide 24/7 supervision and assistance at [place].  Patient demonstrates good rehab potential, and should benefit from continued skilled occupational therapy services while in acute care to maximize safety, independence and quality of life at home.  Continued occupational therapy services in a SNF setting prior to return home is recommended.  ?   Follow  Up Recommendations  SNF;Supervision/Assistance - 24 hour    Equipment Recommendations  3 in 1 bedside commode (Will also defer to post-acute recommendations)    Recommendations for Other Services       Precautions / Restrictions Precautions Precautions: Fall Restrictions Weight Bearing Restrictions: No      Mobility Bed Mobility               General bed mobility comments: Pt already EOB as OT entered.    Transfers Overall transfer level: Needs assistance Equipment used: 2 person hand held assist Transfers: Sit to/from [facility] Sit to Stand: Min assist;+2 physical assistance;From elevated surface Stand pivot transfers: Min assist;+2 physical assistance       General transfer comment: Pt stood from elevated EOB with Min assist of 2 people, offering HHA.  Step by step verbal and visual cues for pivot to recliner with Min As/HHA of 2.    Balance Overall balance assessment: Needs assistance Sitting-balance support: Feet unsupported;No upper extremity supported Sitting balance-Leahy Scale: Poor   Postural control: Posterior lean Standing balance support: Bilateral upper extremity supported Standing balance-Leahy Scale: Poor Standing balance comment: Required BUE support with assist of 2.                           ADL either performed or assessed with clinical judgement   ADL Overall ADL's : Needs assistance/impaired Eating/Feeding: Minimal assistance;Cueing for sequencing;Sitting Eating/Feeding Details (indicate cue type and reason): Pt sitting EOB with PT and SLP as OT entered room.  Pt feeding self with verbal cues and increased time. At time reaching in wrong direction or placing blanket in mouth rather than utensil, but with cues able to  handle fork in RT hand and bring to mouth. 2nd person required posterior to pt due to pt pushing/leaning backwards. Grooming: Wash/dry face;Minimal assistance;Cueing for sequencing;Sitting Grooming  Details (indicate cue type and reason): In chair Upper Body Bathing: Maximal assistance;Bed level Upper Body Bathing Details (indicate cue type and reason): Based on current level of confusion and general assessment. Lower Body Bathing: Maximal assistance;Bed level Lower Body Bathing Details (indicate cue type and reason): Based on current level of confusion and general assessment. Upper Body Dressing : Maximal assistance;Bed level Upper Body Dressing Details (indicate cue type and reason): Based on current level of confusion and general assessment. Lower Body Dressing: Total assistance Lower Body Dressing Details (indicate cue type and reason): To don socks.   Toilet Transfer Details (indicate cue type and reason): Please refetr to mobility section. Toileting- Clothing Manipulation and Hygiene: Total assistance;Bed level Toileting - Clothing Manipulation Details (indicate cue type and reason): Currently on pur wick.     Functional mobility during ADLs: +2 for safety/equipment;Minimal assistance;Moderate assistance       Vision Baseline Vision/History: Wears glasses Wears Glasses: At all times Additional Comments: No apparet changes. Unable to formally assess due to confusion.     Perception     Praxis      Pertinent Vitals/Pain Pain Assessment: Faces Faces Pain Scale: Hurts a little bit Pain Location: pt unable to state Pain Descriptors / Indicators: Discomfort;Grimacing Pain Intervention(s): Limited activity within patient's tolerance;Monitored during session;Repositioned     Hand Dominance Right   Extremity/Trunk Assessment Upper Extremity Assessment Upper Extremity Assessment: Generalized weakness (Worsening UE tremor per husband report)   Lower Extremity Assessment Lower Extremity Assessment: Generalized weakness   Cervical / Trunk Assessment Cervical / Trunk Assessment: Normal   Communication Communication Communication: Expressive difficulties (Ocassional word  salad and word finding issues. Please also refer to SLP evaluation.)   Cognition Arousal/Alertness: Awake/alert Behavior During Therapy: Flat affect Overall Cognitive Status: Impaired/Different from baseline Area of Impairment: Orientation;Following commands;Safety/judgement;Awareness;Problem solving;Memory;Attention                 Orientation Level: Disoriented to;Place;Time;Situation Current Attention Level: Focused Memory: Decreased recall of precautions;Decreased short-term memory Following Commands: Follows one step commands inconsistently;Follows one step commands with increased time Safety/Judgement: Decreased awareness of safety;Decreased awareness of deficits   Problem Solving: Slow processing;Difficulty sequencing;Requires verbal cues;Requires tactile cues;Decreased initiation General Comments: spouse reports signficant decline in cognition a few days prior to admission, pt able to follow simple commands with increased time.  At baseline, can carry on simple conversations perc chart.   General Comments       Exercises     Shoulder Instructions      Home Living Family/patient expects to be discharged to:: Skilled nursing facility Living Arrangements: Spouse/significant other   Type of Home: Independent living facility                       Home Equipment: Shower seat          Prior Functioning/Environment Level of Independence: Needs assistance  Gait / Transfers Assistance Needed: pt ambulatory without assistive device, spouse reports decline in function over the past couple months however steady fast decline over 4-5 days leading to admission. ADL's / Homemaking Assistance Needed: Prior to last week, husband reports that pt performed all her ADLs independently, then suddenly with last few weeks, pt was unable to don her bra and began to require incerasing assistance with all self care. Marland Kitchen  OT Problem List: Decreased  strength;Pain;Decreased coordination;Decreased cognition;Decreased activity tolerance;Decreased safety awareness;Impaired balance (sitting and/or standing);Decreased knowledge of use of DME or AE;Decreased knowledge of precautions;Impaired UE functional use      OT Treatment/Interventions: Self-care/ADL training;Therapeutic exercise;Therapeutic activities;Energy conservation;Visual/perceptual remediation/compensation;DME and/or AE instruction;Patient/family education;Balance training    OT Goals(Current goals can be found in the care plan section) Acute Rehab OT Goals Patient Stated Goal: Per spouse: To be able to resume safely caring for pt at home. OT Goal Formulation: Patient unable to participate in goal setting Time For Goal Achievement: 08/30/20 Potential to Achieve Goals: Good ADL Goals Pt Will Perform Eating: with set-up;with supervision;sitting Pt Will Perform Grooming: sitting;standing;with set-up;with supervision Pt Will Perform Upper Body Bathing: with supervision;sitting Pt Will Perform Lower Body Bathing: sitting/lateral leans;with min guard assist Pt Will Perform Upper Body Dressing: with set-up;sitting Pt Will Perform Lower Body Dressing: with set-up;with supervision;sitting/lateral leans;sit to/from stand Pt Will Transfer to Toilet: with supervision;ambulating;regular height toilet;bedside commode;grab bars Additional ADL Goal #1: Pt will demonstrate improved sitting balance from poor to fair or better in order to perform seated ADLs with increased safety.  OT Frequency: Min 2X/week   Barriers to D/C:            Co-evaluation PT/OT/SLP Co-Evaluation/Treatment: Yes Reason for Co-Treatment: For patient/therapist safety;To address functional/ADL transfers PT goals addressed during session: Mobility/safety with mobility;Balance OT goals addressed during session: ADL's and self-care SLP goals addressed during session: Swallowing;Cognition    AM-PAC OT "6 Clicks" Daily  Activity     Outcome Measure Help from another person eating meals?: A Little Help from another person taking care of personal grooming?: A Lot Help from another person toileting, which includes using toliet, bedpan, or urinal?: Total Help from another person bathing (including washing, rinsing, drying)?: A Lot Help from another person to put on and taking off regular upper body clothing?: A Lot Help from another person to put on and taking off regular lower body clothing?: Total 6 Click Score: 11   End of Session Equipment Utilized During Treatment: Gait belt Nurse Communication: Mobility status  Activity Tolerance: Patient tolerated treatment well Patient left: in chair;with call bell/phone within reach;with chair alarm set;with family/visitor present  OT Visit Diagnosis: Unsteadiness on feet (R26.81);Cognitive communication deficit (R41.841);Feeding difficulties (R63.3);Muscle weakness (generalized) (M62.81);Other symptoms and signs involving cognitive function                Time: 4332-9518 OT Time Calculation (min): 28 min Charges:  OT General Charges $OT Visit: 1 Visit OT Evaluation $OT Eval Moderate Complexity: 1 Mod  Husam Hohn, OT Acute Rehab Services Office: [phone] 08/16/2020  Julien Girt 08/16/2020, 11:51 AM

## 2020-08-16 NOTE — Plan of Care (Signed)
  Problem: Activity: Goal: Risk for activity intolerance will decrease Outcome: Progressing   Problem: Safety: Goal: Ability to remain free from injury will improve Outcome: Progressing   Problem: Education: Goal: Knowledge of General Education information will improve Description: Including pain rating scale, medication(s)/side effects and non-pharmacologic comfort measures Outcome: Not Progressing   Problem: Nutrition: Goal: Adequate nutrition will be maintained Outcome: Not Progressing   Problem: Pain Managment: Goal: General experience of comfort will improve Outcome: Completed/Met

## 2020-08-16 NOTE — Progress Notes (Signed)
Hypoglycemic Event  CBG: 69  Treatment: 4 oz OJ and applesauce  Symptoms: Shaky when transferring. Pt. Stated felt weak.   Follow-up CBG: Time: 7014 CBG Result:58 Given 4 oz OJ with sugar Follow up CBG Time 0316 Result: 98  Possible Reasons for Event: Pt was NPO for most of day; ate small dinner at [place].  Comments/MD notified: On call MD paged    [place]ie [place]

## 2020-08-16 NOTE — Progress Notes (Signed)
PROGRESS NOTE    April Decker  ZOX:[id number] DOB: April 16, 1941 DOA: 08/15/2020 PCP: Haywood Pao, MD    Brief Narrative:  79 year old female with history of chronic A. fib, hypertension and advanced dementia brought to ER with about 4 days of weakness, lethargic and poorly responsive.  Reportedly at baseline she can ambulate and keep up simple conversation.  Recently worsening symptoms.  In the emergency room, hemodynamically stable.  Noted to have acute kidney injury.  WBC count 17.  Evidence of UTI.  Lactic acid 2.5.  Mildly elevated troponin.  Treated with IV fluids and Rocephin and admitted to the hospital.   Assessment & Plan:   Active Problems:   Atrial fibrillation (HCC)   Hypertension   Alzheimer's dementia without behavioral disturbance (HCC)   GERD (gastroesophageal reflux disease)   AMS (altered mental status)   AKI (acute kidney injury) ([place])   Sepsis ([place])   Dehydration  Acute metabolic encephalopathy in the setting of underlying advanced dementia: Mostly multifactorial secondary to infection, dehydration.  Will treat with fluid and antibiotics.  No focal neurological deficits.  Acute UTI present on admission: On Rocephin.  Continue until final cultures.  Acute kidney injury: Treated with IV fluids with improvement.  Encourage oral intake.  Hypokalemia/hypophosphatemia: Replace aggressively and monitor levels.  Chronic A. fib: Rate controlled.  In sinus rhythm today.  Not an anticoagulation candidate.  Dementia with behavioral disturbances: All-time fall precautions.  On Aricept and Wellbutrin.  Looking forward for short-term SNF for inpatient therapies before returning home.  Hypoglycemia: With poor oral intake.  Encourage nutrition.  Verify low blood sugars with serum levels.   DVT prophylaxis: enoxaparin (LOVENOX) injection 40 mg Start: 08/16/20 2200   Code Status: DNR Family Communication: None Disposition Plan: Status is: Inpatient  Remains  inpatient appropriate because:Inpatient level of care appropriate due to severity of illness   Dispo: The patient is from: ALF              Anticipated d/c is to: SNF              Anticipated d/c date is: 2 days              Patient currently is not medically stable to d/c.         Consultants:   None  Procedures:   None  Antimicrobials:  Anti-infectives (From admission, onward)   Start     Dose/Rate Route Frequency Ordered Stop   08/15/20 1400  cefTRIAXone (ROCEPHIN) 1 g in sodium chloride 0.9 % 100 mL IVPB        1 g 200 mL/hr over 30 Minutes Intravenous Every 24 hours 08/15/20 1349           Subjective: Patient was seen and examined.  No overnight events.  She would not talk to [place].  Patient was just laying in the bed with eyes open.  Remains with low-grade temperature overnight.  Objective: Vitals:   08/16/20 0238 08/16/20 0409 08/16/20 0510 08/16/20 0610  BP: (!) 148/66  (!) 135/94   Pulse: 70  66   Resp: 18  18   Temp: 98.9 F (37.2 C)  (!) 100.6 F (38.1 C) 98.7 F (37.1 C)  TempSrc: Oral  Axillary   SpO2: 99%  98%   Weight:  48.6 kg    Height:        Intake/Output Summary (Last 24 hours) at 08/16/2020 1337 Last data filed at 08/16/2020 1100 Gross per 24 hour  Intake  2758.89 ml  Output --  Net 2758.89 ml   Filed Weights   08/15/20 1205 08/16/20 0409  Weight: 47.6 kg 48.6 kg    Examination:  General exam: Chronically sick looking.  Calm and comfortable and confused. Respiratory system: Clear to auscultation. Respiratory effort normal.  No added sounds. Cardiovascular system: S1 & S2 heard, RRR. Gastrointestinal system: Soft and nontender.   Central nervous system: Alert but not oriented. Extremities: Symmetric 5 x 5 power.  Moves all extremities. Flat affect.   Data Reviewed: I have personally reviewed following labs and imaging studies  CBC: Recent Labs  Lab 08/15/20 1230 08/16/20 0918  WBC 17.3* 10.7*  NEUTROABS 15.7* 9.3*   HGB 12.1 12.4  HCT 35.3* 37.5  MCV 94.4 97.2  PLT 187 562   Basic Metabolic Panel: Recent Labs  Lab 08/15/20 1230 08/15/20 1946 08/16/20 0918  NA 133* 138 136  K 3.3* 3.6 3.4*  CL 101 105 102  CO2 24 24 23   GLUCOSE 160* 91 137*  BUN 26* 20 20  CREATININE 1.06* 0.90 0.82  CALCIUM 9.2 8.9 9.2  MG  --  2.0 2.1  PHOS  --  3.0 1.5*   GFR: Estimated Creatinine Clearance: 41.3 mL/min (by C-G formula based on SCr of 0.82 mg/dL). Liver Function Tests: Recent Labs  Lab 08/15/20 1230 08/16/20 0918  AST 28 41  ALT 17 31  ALKPHOS 42 49  BILITOT 1.2 0.6  PROT 6.2* 6.7  ALBUMIN 3.5 3.8   No results for input(s): LIPASE, AMYLASE in the last 168 hours. Recent Labs  Lab 08/15/20 1543  AMMONIA <9*   Coagulation Profile: No results for input(s): INR, PROTIME in the last 168 hours. Cardiac Enzymes: Recent Labs  Lab 08/15/20 1946  CKTOTAL 158   BNP (last 3 results) No results for input(s): PROBNP in the last 8760 hours. HbA1C: No results for input(s): HGBA1C in the last 72 hours. CBG: Recent Labs  Lab 08/16/20 0252 08/16/20 0316 08/16/20 0549 08/16/20 0733 08/16/20 0832  GLUCAP 58* 98 81 75 152*   Lipid Profile: No results for input(s): CHOL, HDL, LDLCALC, TRIG, CHOLHDL, LDLDIRECT in the last 72 hours. Thyroid Function Tests: Recent Labs    08/16/20 0918  TSH 3.832   Anemia Panel: No results for input(s): VITAMINB12, FOLATE, FERRITIN, TIBC, IRON, RETICCTPCT in the last 72 hours. Sepsis Labs: Recent Labs  Lab 08/15/20 1317 08/15/20 1523 08/15/20 1946  PROCALCITON  --   --  14.93  LATICACIDVEN 2.5* 1.4  --     Recent Results (from the past 240 hour(s))  Urine culture     Status: None   Collection Time: 08/15/20  1:17 PM   Specimen: Urine, Random  Result Value Ref Range Status   Specimen Description   Final    URINE, RANDOM Performed at [facility], [address] [place], [place] [address]    Special Requests   Final     NONE Performed at [facility], [address] [place], [place] [address]    Culture   Final    NO GROWTH Performed at [facility], [place] [address] [place], [place] [address]    Report Status 08/16/2020 FINAL  Final  Resp Panel by RT-PCR (Flu A&B, Covid) Nasopharyngeal Swab     Status: None   Collection Time: 08/15/20  1:18 PM   Specimen: Nasopharyngeal Swab; Nasopharyngeal(NP) swabs in vial transport medium  Result Value Ref Range Status   SARS  Coronavirus 2 by RT PCR NEGATIVE NEGATIVE Final    Comment: (NOTE) SARS-CoV-2 target nucleic acids are NOT DETECTED.  The SARS-CoV-2 RNA is generally detectable in upper respiratory specimens during the acute phase of infection. The lowest concentration of SARS-CoV-2 viral copies this assay can detect is 138 copies/mL. A negative result does not preclude SARS-Cov-2 infection and should not be used as the sole basis for treatment or other patient management decisions. A negative result may occur with  improper specimen collection/handling, submission of specimen other than nasopharyngeal swab, presence of viral mutation(s) within the areas targeted by this assay, and inadequate number of viral copies(<138 copies/mL). A negative result must be combined with clinical observations, patient history, and epidemiological information. The expected result is Negative.  Fact Sheet for Patients:  EntrepreneurPulse.com.au  Fact Sheet for Healthcare Providers:  IncredibleEmployment.be  This test is no t yet approved or cleared by the [place] FDA and  has been authorized for detection and/or diagnosis of SARS-CoV-2 by FDA under an Emergency Use Authorization (EUA). This EUA will remain  in effect (meaning this test can be used) for the duration of the COVID-19 declaration under Section 564(b)(1) of the Act, 21 U.S.C.section 360bbb-3(b)(1), unless the authorization is terminated  or revoked  sooner.       Influenza A by PCR NEGATIVE NEGATIVE Final   Influenza B by PCR NEGATIVE NEGATIVE Final    Comment: (NOTE) The Xpert Xpress SARS-CoV-2/FLU/RSV plus assay is intended as an aid in the diagnosis of influenza from Nasopharyngeal swab specimens and should not be used as a sole basis for treatment. Nasal washings and aspirates are unacceptable for Xpert Xpress SARS-CoV-2/FLU/RSV testing.  Fact Sheet for Patients: EntrepreneurPulse.com.au  Fact Sheet for Healthcare Providers: IncredibleEmployment.be  This test is not yet approved or cleared by the [place] FDA and has been authorized for detection and/or diagnosis of SARS-CoV-2 by FDA under an Emergency Use Authorization (EUA). This EUA will remain in effect (meaning this test can be used) for the duration of the COVID-19 declaration under Section 564(b)(1) of the Act, 21 U.S.C. section 360bbb-3(b)(1), unless the authorization is terminated or revoked.  Performed at [facility], [address] [place], [place] [address]   Blood Culture (routine x 2)     Status: None (Preliminary result)   Collection Time: 08/15/20  2:04 PM   Specimen: Right Antecubital; Blood  Result Value Ref Range Status   Specimen Description   Final    RIGHT ANTECUBITAL BLOOD Performed at [facility], [place] [address], [place], [place] [address]    Special Requests   Final    BOTTLES DRAWN AEROBIC AND ANAEROBIC Blood Culture adequate volume Performed at [facility], [address] [place], [place] [address]    Culture   Final    NO GROWTH < 24 HOURS Performed at [facility], [place] [address] [place], [place] [address]    Report Status PENDING  Incomplete         Radiology Studies: CT ABDOMEN PELVIS WO CONTRAST  Result Date: 08/15/2020 CLINICAL DATA:  Abdominal pain.  Diverticulitis suspected. EXAM: CT ABDOMEN AND PELVIS WITHOUT CONTRAST TECHNIQUE:  Multidetector CT imaging of the abdomen and pelvis was performed following the standard protocol without IV contrast. COMPARISON:  Jan 20, 2019 FINDINGS: Lower chest: Minimal bilateral left greater than right pleural thickening versus effusion. Hepatobiliary: No focal liver abnormality is seen. No gallstones, gallbladder wall thickening, or biliary dilatation. Pancreas:  Unremarkable. No pancreatic ductal dilatation or surrounding inflammatory changes. Spleen: Normal in size without focal abnormality. Adrenals/Urinary Tract: Adrenal glands are unremarkable. Kidneys are normal, without renal calculi, focal lesion, or hydronephrosis. Bladder is unremarkable. Stomach/Bowel: Stomach is within normal limits. No evidence of appendicitis. No evidence of bowel wall thickening, distention, or inflammatory changes. No evidence of diverticulosis. Moderate amount of formed stool throughout the colon. Vascular/Lymphatic: Aortic atherosclerosis. No enlarged abdominal or pelvic lymph nodes. Reproductive: Atrophic or surgically absent uterus. No adnexal masses. Other: No abdominal wall hernia or abnormality. No abdominopelvic ascites. Musculoskeletal: No acute osseous findings. Mild spondylosis of the lumbosacral spine. IMPRESSION: 1. No evidence of acute abnormalities within the abdomen or pelvis. 2. Moderate amount of formed stool throughout the colon, consistent with constipation. 3. Minimal bilateral left greater than right pleural thickening versus effusion. 4. Aortic atherosclerosis. Aortic Atherosclerosis (ICD10-I70.0). Electronically Signed   By: Fidela Salisbury M.D.   On: 08/15/2020 16:44   DG Chest 2 View  Result Date: 08/15/2020 CLINICAL DATA:  Confusion weakness over the past 3 days. EXAM: CHEST - 2 VIEW COMPARISON:  Chest radiograph dated 01/31/2014. FINDINGS: The heart size and mediastinal contours are within normal limits. The lungs are hyperinflated, consistent with emphysema. Both lungs are clear.  Degenerative changes are seen in the spine. IMPRESSION: Emphysema without acute pulmonary process. Electronically Signed   By: Zerita Boers M.D.   On: 08/15/2020 14:40   CT Head Wo Contrast  Result Date: 08/15/2020 CLINICAL DATA:  Altered mental status over the past 3 days. EXAM: CT HEAD WITHOUT CONTRAST TECHNIQUE: Contiguous axial images were obtained from the base of the skull through the vertex without intravenous contrast. COMPARISON:  CT head dated 01/31/2014. FINDINGS: Brain: No evidence of acute infarction, hemorrhage, hydrocephalus, extra-axial collection or mass lesion/mass effect. There is mild cerebral volume loss with associated ex vacuo dilatation. Periventricular white matter hypoattenuation likely represents chronic small vessel ischemic disease. Vascular: No hyperdense vessel or unexpected calcification. Skull: Normal. Negative for fracture or focal lesion. Sinuses/Orbits: No acute finding. Other: None. IMPRESSION: No acute intracranial process. Electronically Signed   By: Zerita Boers M.D.   On: 08/15/2020 14:34        Scheduled Meds: . aspirin EC  81 mg Oral QPM  . buPROPion  300 mg Oral Daily  . donepezil  5 mg Oral QHS  . enoxaparin (LOVENOX) injection  40 mg Subcutaneous Q24H  . escitalopram  10 mg Oral Daily  . pantoprazole  40 mg Oral Daily  . senna  1 tablet Oral BID   Continuous Infusions: . cefTRIAXone (ROCEPHIN)  IV Stopped (08/15/20 1538)  . potassium PHOSPHATE IVPB (in mmol)       LOS: 1 day    Time spent: 30 minutes    Barb Merino, MD [facility] Pager [phone]

## 2020-08-17 ENCOUNTER — Inpatient Hospital Stay (HOSPITAL_COMMUNITY): Payer: Medicare Other

## 2020-08-17 DIAGNOSIS — E43 Unspecified severe protein-calorie malnutrition: Secondary | ICD-10-CM | POA: Insufficient documentation

## 2020-08-17 LAB — GLUCOSE, CAPILLARY
Glucose-Capillary: 72 mg/dL (ref 70–99)
Glucose-Capillary: 86 mg/dL (ref 70–99)
Glucose-Capillary: 97 mg/dL (ref 70–99)

## 2020-08-17 MED ORDER — ADULT MULTIVITAMIN LIQUID CH
15.0000 mL | Freq: Every day | ORAL | Status: DC
  Administered 2020-08-18 – 2020-08-19 (×2): 15 mL via ORAL
  Filled 2020-08-17 (×4): qty 15

## 2020-08-17 MED ORDER — SODIUM CHLORIDE 0.9 % IV SOLN
2.0000 g | Freq: Two times a day (BID) | INTRAVENOUS | Status: DC
  Administered 2020-08-17 – 2020-08-19 (×4): 2 g via INTRAVENOUS
  Filled 2020-08-17 (×2): qty 20
  Filled 2020-08-17: qty 2
  Filled 2020-08-17 (×2): qty 20

## 2020-08-17 MED ORDER — SODIUM CHLORIDE 0.9 % IV SOLN
2.0000 g | Freq: Four times a day (QID) | INTRAVENOUS | Status: DC
  Administered 2020-08-17 – 2020-08-18 (×4): 2 g via INTRAVENOUS
  Filled 2020-08-17: qty 2000
  Filled 2020-08-17: qty 2
  Filled 2020-08-17: qty 2000
  Filled 2020-08-17: qty 2
  Filled 2020-08-17: qty 2000

## 2020-08-17 MED ORDER — SODIUM CHLORIDE 0.9 % IV SOLN: INTRAVENOUS | Status: DC

## 2020-08-17 MED ORDER — VANCOMYCIN HCL IN DEXTROSE 1-5 GM/200ML-% IV SOLN
1000.0000 mg | INTRAVENOUS | Status: DC
  Administered 2020-08-17: 1000 mg via INTRAVENOUS
  Filled 2020-08-17: qty 200

## 2020-08-17 MED ORDER — VANCOMYCIN HCL IN DEXTROSE 1-5 GM/200ML-% IV SOLN: 1000.0000 mg | Freq: Once | INTRAVENOUS | Status: DC

## 2020-08-17 MED ORDER — DEXTROSE 5 % IV SOLN
500.0000 mg | Freq: Three times a day (TID) | INTRAVENOUS | Status: DC
  Administered 2020-08-17 – 2020-08-18 (×2): 500 mg via INTRAVENOUS
  Filled 2020-08-17 (×3): qty 10

## 2020-08-17 MED ORDER — ENSURE ENLIVE PO LIQD
237.0000 mL | ORAL | Status: DC
  Administered 2020-08-18: 237 mL via ORAL

## 2020-08-17 MED ORDER — BOOST / RESOURCE BREEZE PO LIQD CUSTOM
1.0000 | Freq: Two times a day (BID) | ORAL | Status: DC
  Administered 2020-08-18 – 2020-08-20 (×6): 1 via ORAL

## 2020-08-17 NOTE — Plan of Care (Signed)
  Problem: Education: Goal: Knowledge of General Education information will improve Description: Including pain rating scale, medication(s)/side effects and non-pharmacologic comfort measures Outcome: Progressing   Problem: Health Behavior/Discharge Planning: Goal: Ability to manage health-related needs will improve Outcome: Progressing   Problem: Clinical Measurements: Goal: Ability to maintain clinical measurements within normal limits will improve Outcome: Progressing Goal: Will remain free from infection Outcome: Progressing Goal: Diagnostic test results will improve Outcome: Progressing Goal: Respiratory complications will improve Outcome: Progressing Goal: Cardiovascular complication will be avoided Outcome: Progressing   Problem: Activity: Goal: Risk for activity intolerance will decrease Outcome: Progressing   Problem: Nutrition: Goal: Adequate nutrition will be maintained Outcome: Progressing   Problem: Coping: Goal: Level of anxiety will decrease Outcome: Progressing   Problem: Elimination: Goal: Will not experience complications related to bowel motility Outcome: Progressing Goal: Will not experience complications related to urinary retention Outcome: Progressing   Problem: Safety: Goal: Ability to remain free from injury will improve Outcome: Progressing   Problem: Skin Integrity: Goal: Risk for impaired skin integrity will decrease Outcome: Progressing   Problem: Urinary Elimination: Goal: Signs and symptoms of infection will decrease Outcome: Progressing

## 2020-08-17 NOTE — NC FL2 (Signed)
[place] LEVEL OF CARE SCREENING TOOL     IDENTIFICATION  Patient Name: April Decker Birthdate: 02/10/1941 Sex: female Admission Date (Current Location): 08/15/2020  [facility] and [place] Number:  Herbalist and Address:  [facility],  [place] [address], [place]      Provider Number: [id number]  Attending Physician Name and Address:  Barb Merino, MD  Relative Name and Phone Number:  [place] [phone]  [phone]    Current Level of Care: Hospital Recommended Level of Care: [place] Prior Approval Number:    Date Approved/Denied:   PASRR Number: [id number]  Discharge Plan: SNF    Current Diagnoses: Patient Active Problem List   Diagnosis Date Noted  . AMS (altered mental status) 08/15/2020  . AKI (acute kidney injury) ([place]) 08/15/2020  . Sepsis ([place]) 08/15/2020  . Dehydration 08/15/2020  . Severe episode of recurrent major depressive disorder, without psychotic features ([place]) 12/30/2019  . Alzheimer's dementia without behavioral disturbance ([place]) 10/07/2019  . Major neurocognitive disorder ([place]) 07/29/2019  . Memory loss 07/29/2019  . Medication side effect, initial encounter 03/04/2019  . Chronic anxiety 09/05/2018  . Depression 09/05/2018  . Dysphagia 09/05/2018  . Fatigue 09/05/2018  . GERD (gastroesophageal reflux disease) 09/05/2018  . Hearing impairment 09/05/2018  . IBS (irritable bowel syndrome) 09/05/2018  . Osteoarthritis 09/05/2018  . Osteopenia 09/05/2018  . Trigeminal neuralgia 09/05/2018  . Status post cataract extraction of both eyes with insertion of intraocular lens 04/26/2018  . Hyperopia with astigmatism and presbyopia, bilateral 04/27/2017  . Myopia with astigmatism and presbyopia, bilateral 04/27/2017  . Hypertension 02/18/2013  . Atrial fibrillation ([place]) 06/21/2010  . EDEMA 06/21/2010  . Benign essential hypertension 12/17/2009  . Syncope 12/17/2009   . PALPITATIONS, OCCASIONAL 12/17/2009    Orientation RESPIRATION BLADDER Height & Weight     Self, Time, Situation, Place  Normal Incontinent Weight: 107 lb 2.3 oz (48.6 kg) Height:  5\' 6"  (167.6 cm)  BEHAVIORAL SYMPTOMS/MOOD NEUROLOGICAL BOWEL NUTRITION STATUS      Incontinent Diet  AMBULATORY STATUS COMMUNICATION OF NEEDS Skin   Limited Assist Verbally Normal                       Personal Care Assistance Level of Assistance  Bathing, Feeding, Dressing Bathing Assistance: Limited assistance Feeding assistance: Independent Dressing Assistance: Limited assistance     Functional Limitations Info  Sight, Hearing, Speech Sight Info: Adequate Hearing Info: Adequate Speech Info: Adequate    SPECIAL CARE FACTORS FREQUENCY                       Contractures Contractures Info: Not present    Additional Factors Info  Allergies, Code Status, Psychotropic Code Status Info: DNR Allergies Info: Diltiazem Iodides Other Shellfish Allergy Iohexol Psychotropic Info: buPROPion (WELLBUTRIN XL) 24 hr tablet 300 mg, escitalopram (LEXAPRO) tablet 10 mg, QUEtiapine (SEROQUEL) tablet 25 mg         Current Medications (08/17/2020):  This is the current hospital active medication list Current Facility-Administered Medications  Medication Dose Route Frequency Provider Last Rate Last Admin  . acetaminophen (TYLENOL) tablet 650 mg  650 mg Oral Q6H PRN Toy Baker, MD   650 mg at 08/16/20 0515   Or  . acetaminophen (TYLENOL) suppository 650 mg  650 mg Rectal Q6H PRN Doutova, Anastassia, MD      . aspirin EC tablet 81 mg  81 mg Oral  QPM Toy Baker, MD   81 mg at 08/16/20 1730  . bisacodyl (DULCOLAX) suppository 10 mg  10 mg Rectal Daily PRN Toy Baker, MD   10 mg at 08/16/20 1730  . buPROPion (WELLBUTRIN XL) 24 hr tablet 300 mg  300 mg Oral Daily Doutova, Anastassia, MD   300 mg at 08/16/20 1136  . cefTRIAXone (ROCEPHIN) 1 g in sodium chloride 0.9 % 100 mL  IVPB  1 g Intravenous Q24H Barb Merino, MD 200 mL/hr at 08/16/20 1353 1 g at 08/16/20 1353  . donepezil (ARICEPT) tablet 5 mg  5 mg Oral QHS Doutova, Anastassia, MD   5 mg at 08/16/20 2016  . enoxaparin (LOVENOX) injection 40 mg  40 mg Subcutaneous Q24H Poindexter, Leann T, RPH   40 mg at 08/16/20 2019  . escitalopram (LEXAPRO) tablet 10 mg  10 mg Oral Daily Doutova, Anastassia, MD   10 mg at 08/16/20 1135  . haloperidol (HALDOL) tablet 5 mg  5 mg Oral Q6H PRN Barb Merino, MD   5 mg at 08/16/20 1518  . LORazepam (ATIVAN) injection 0.5 mg  0.5 mg Intravenous Q4H PRN Barb Merino, MD   0.5 mg at 08/16/20 2018  . ondansetron (ZOFRAN) tablet 4 mg  4 mg Oral Q6H PRN Doutova, Anastassia, MD       Or  . ondansetron (ZOFRAN) injection 4 mg  4 mg Intravenous Q6H PRN Doutova, Anastassia, MD      . pantoprazole (PROTONIX) EC tablet 40 mg  40 mg Oral Daily Doutova, Anastassia, MD   40 mg at 08/16/20 1136  . polyethylene glycol (MIRALAX / GLYCOLAX) packet 17 g  17 g Oral Daily PRN Doutova, Anastassia, MD      . QUEtiapine (SEROQUEL) tablet 25 mg  25 mg Oral QHS Barb Merino, MD   25 mg at 08/16/20 2017  . senna (SENOKOT) tablet 8.6 mg  1 tablet Oral BID Toy Baker, MD   8.6 mg at 08/16/20 2000     Discharge Medications: Please see discharge summary for a list of discharge medications.  Relevant Imaging Results:  Relevant Lab Results:   Additional Information SSN [id number]  Ross Ludwig, LCSW

## 2020-08-17 NOTE — Progress Notes (Signed)
Pharmacy Antibiotic Note  April Decker is a 79 y.o. female admitted on 08/15/2020 with meningitis.  Pharmacy has been consulted for vanc dosing. Ampicillin/rocephin/acyclovir per Md  Plan: Vanc 1g IV q24 per current renal function/wt - goal trough 15-20 Adjusted ampicillin to 2g IV q6 due to current CrCl No changes to rocephin or acyclovir  Height: 5\' 6"  (167.6 cm) Weight: 48.6 kg (107 lb 2.3 oz) IBW/kg (Calculated) : 59.3  Temp (24hrs), Avg:98.7 F (37.1 C), Min:97.8 F (36.6 C), Max:101.3 F (38.5 C)  Recent Labs  Lab 08/15/20 1230 08/15/20 1317 08/15/20 1523 08/15/20 1946 08/16/20 0918  WBC 17.3*  --   --   --  10.7*  CREATININE 1.06*  --   --  0.90 0.82  LATICACIDVEN  --  2.5* 1.4  --   --     Estimated Creatinine Clearance: 41.3 mL/min (by C-G formula based on SCr of 0.82 mg/dL).    Allergies  Allergen Reactions  . Diltiazem Swelling    edema  . Iodides Anaphylaxis  . Other Anaphylaxis and Other (See Comments)    X-Ray Dye--brain stem  Other reaction(s): ANAPHYLAXIS    . Shellfish Allergy Hives  . Iohexol Hives            Thank you for allowing pharmacy to be a part of this patient's care.  Kara Mead 08/17/2020 4:48 PM

## 2020-08-17 NOTE — Progress Notes (Signed)
Initial Nutrition Assessment  DOCUMENTATION CODES:   Severe malnutrition in context of chronic illness, Underweight  INTERVENTION:  - will order Boost Breeze BID, each supplement provides 250 kcal and 9 grams of protein. - will order Ensure Enlive once/day, each supplement provides 350 kcal and 20 grams of protein. - will order Magic Cup BID with meals, each supplement provides 290 kcal and 9 grams of protein. - will order 15 ml multivitamin/day. - liberalize diet from Heart Healthy to Regular.  - monitor for ongoing GOC/POC discussions.   NUTRITION DIAGNOSIS:   Severe Malnutrition related to chronic illness (advanced dementia) as evidenced by moderate fat depletion, moderate muscle depletion, severe fat depletion, severe muscle depletion.  GOAL:   Patient will meet greater than or equal to 90% of their needs  MONITOR:   PO intake, Supplement acceptance, Labs, Weight trends  REASON FOR ASSESSMENT:   Consult Assessment of nutrition requirement/status  ASSESSMENT:   79 year old female with history of chronic A. fib, HTN, and advanced dementia. She presented to the ED with 4-day hx of weakness, lethargy, and poor responsiveness. At baseline, she is able to ambulate and hold simple conversations. In the ED, she was noted to have AKI and UTI.  Patient sleeping throughout RD visit. She is noted to be a/o to self only. Her husband was in the room and provided all information.   He has had difficult time today because of conversations surround possible hospice and because of how quickly she declined.   They live together at [facility] and eat meals prepared by the facility staff. Patient eats at least 2 meals/day. She has never been a big eater and intakes had not changed noticeably until 11/26 after which time she would only eat a few bites at a time, including at dinner last night.   Husband reports that patient has mainly been sleeping today as she was during visit.   SLP  saw patient yesterday and cleared her for regular consistency foods and thin liquids.   Husband reports that patient has never weighed >125 lb. He states that in the 3-4 months after moving to [facility] in December 2019, patient lost 15 lb.   Weight yesterday was 107 lb and PTA the most recently documented weights were 04/01/20 at [facility] when she weighed 108 lb and 06/13/20 at [facility] when she weighed 105 lb. The most recently documented weight within [facility] was on 03/05/18 when she weighed 120 lb.    Labs reviewed; CBGs: 58-152 mg/dl, K: 3.4 mmol/l, Phos: 1.5 mg/dl. Medications reviewed; 40 mg oral protonix/day, 30 mmol IV KPhos x1 run 11/29, 1 tablet senokot BID.    NUTRITION - FOCUSED PHYSICAL EXAM:    Most Recent Value  Orbital Region Moderate depletion  Upper Arm Region Severe depletion  Thoracic and Lumbar Region Severe depletion  Buccal Region Severe depletion  Temple Region Severe depletion  Clavicle Bone Region Severe depletion  Clavicle and Acromion Bone Region Severe depletion  Scapular Bone Region Unable to assess  Dorsal Hand Moderate depletion  Patellar Region Moderate depletion  Anterior Thigh Region Severe depletion  Posterior Calf Region Severe depletion  Edema (RD Assessment) None  Hair Reviewed  Eyes Unable to assess  Mouth Unable to assess  Skin Reviewed  Nails Reviewed       Diet Order:   Diet Order            Diet regular Room service appropriate? Yes; Fluid consistency: Thin  Diet effective now  EDUCATION NEEDS:   No education needs have been identified at this time  Skin:  Skin Assessment: Reviewed RN Assessment  Last BM:  11/29 (type 6)  Height:   Ht Readings from Last 1 Encounters:  08/15/20 5\' 6"  (1.676 m)    Weight:   Wt Readings from Last 1 Encounters:  08/16/20 48.6 kg    Estimated Nutritional Needs:  Kcal:  1300-1500 kcal Protein:  60-70 grams Fluid:  >/= 1.7 L/day     Jarome Matin, MS, RD,  LDN, CNSC Inpatient Clinical Dietitian RD pager # available in [facility]  After hours/weekend pager # available in [facility]

## 2020-08-17 NOTE — Progress Notes (Signed)
PROGRESS NOTE    April Decker  RUE:[id number] DOB: 1941/04/01 DOA: 08/15/2020 PCP: Haywood Pao, MD    Brief Narrative:  79 year old female with history of chronic A. fib, hypertension and advanced dementia brought to ER with about 4 days of weakness, lethargic and being poorly responsive. Reportedly at baseline she can ambulate and keep up simple conversation.  Recently worsening symptoms.  In the emergency room, hemodynamically stable.  Noted to have acute kidney injury.  WBC count 17.  Evidence of UTI.  Lactic acid 2.5.  Mildly elevated troponin.  Treated with IV fluids and Rocephin and admitted to the hospital. Subjective:  11/29, remained afebrile but mostly agitated in the daytime, symptoms responded to Haldol and Seroquel. 11/30, remains lethargic.  She started having fever in the afternoon. By afternoon, patient started spiking temperatures, remains persistently lethargic.  Difficult to arouse. Husband at the bedside. Blood cultures and urine cultures from 11/28 -.  Chest x-ray and CT scan abdomen pelvis with no source of infection. Will broaden antibiotics to cover for meningeal encephalitis and discussed with neurology or radiology for lumbar puncture.  Repeat blood cultures done today.   Assessment & Plan:   Active Problems:   Atrial fibrillation (HCC)   Hypertension   Alzheimer's dementia without behavioral disturbance (HCC)   GERD (gastroesophageal reflux disease)   AMS (altered mental status)   AKI (acute kidney injury) ([place])   Sepsis ([place])   Dehydration   Protein-calorie malnutrition, severe  Acute metabolic encephalopathy in the setting of underlying advanced dementia: Mostly multifactorial secondary to infection, dehydration.  Will treat with fluid and antibiotics.  No focal neurological deficits.  Fever episode/altered mentation: Initially thought to be from UTI.  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 Patient is already on Rocephin. Broaden antibiotic coverage with vancomycin, ampicillin, continue Rocephin. We will cover with acyclovir for meningeal encephalitis. We will arrange for lumbar puncture.  Acute kidney injury: Treated with IV fluids with improvement.    Hypokalemia/hypophosphatemia: Replaced.  Chronic A. fib: Rate controlled.  In sinus rhythm today.  Not an anticoagulation candidate.  Sinus tachycardic.  Hypophosphatemia: Replaced aggressively.  Dementia with behavioral disturbances: All-time fall precautions.  On Aricept and Wellbutrin.  Patient continues to have poor response.  She has advanced dementia.  Patient has very rapidly progressive dementia and progressed for the last 2 years. Rule out acute causes. Consult palliative care medicine. Patient may benefit with hospice care if she does not have adequate improvement.  DVT prophylaxis: Lovenox.  Will hold today.   Code Status: DNR Family Communication: Patient's husband, Dr. Erlene Quan at the bedside. Disposition Plan: Status is: Inpatient  Remains inpatient appropriate because:Inpatient level of care appropriate due to severity of illness   Dispo: The patient is from: ALF              Anticipated d/c is to: SNF              Anticipated d/c date is: 2 to 3 days.              Patient currently is not medically stable to d/c.  Patient started spiking fever today.  Will monitor in the hospital on antibiotics and investigations will be done. We will also have palliative care consultation, has been interested in hospice discussion.   Consultants:   None  Procedures:   None  Antimicrobials:  Anti-infectives (From admission, onward)   Start  Dose/Rate Route Frequency Ordered Stop   08/17/20 1800  cefTRIAXone (ROCEPHIN) 2 g in sodium chloride 0.9 % 100 mL IVPB        2 g 200 mL/hr over 30 Minutes Intravenous Every 12 hours 08/17/20 1639     08/17/20 1730  vancomycin (VANCOCIN) IVPB 1000 mg/200  mL premix        1,000 mg 200 mL/hr over 60 Minutes Intravenous  Once 08/17/20 1639     08/17/20 1730  ampicillin (OMNIPEN) 2 g in sodium chloride 0.9 % 100 mL IVPB        2 g 300 mL/hr over 20 Minutes Intravenous Every 6 hours 08/17/20 1639     08/17/20 1730  acyclovir (ZOVIRAX) 485 mg in dextrose 5 % 100 mL IVPB       Note to Pharmacy: 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  She is mostly sleepy.  She is difficult to arouse but follows very simple commands.  Sick looking and tachycardic.  Did not eat anything all day today. Objective: Vitals:   08/17/20 0624 08/17/20 0645 08/17/20 1331 08/17/20 1622  BP: (!) 144/102 (!) 140/93 (!) 134/93 (!) 133/94  Pulse: (!) 115 92 (!) 120 (!) 132  Resp: 20 20 20 20   Temp: 97.8 F (36.6 C) 97.8 F (36.6 C) (!) 101.3 F (38.5 C) 98.3 F (36.8 C)  TempSrc:  Oral Axillary Oral  SpO2:   96% 96%  Weight:      Height:        Intake/Output Summary (Last 24 hours) at 08/17/2020 1645 Last data filed at 08/16/2020 1800 Gross per 24 hour  Intake 89.62 ml  Output --  Net 89.62 ml   Filed Weights   08/15/20 1205 08/16/20 0409  Weight: 47.6 kg 48.6 kg    Examination:  General exam: Sick looking frail and debilitated lady laying in bed.  Febrile now. Neck stiffness present. Respiratory system: Clear to auscultation. Respiratory effort normal.  No added sounds. Cardiovascular system: S1 & S2 heard, RRR.  Tachycardic. Gastrointestinal system: Soft and nontender.   Central nervous system: Alert on a strong stimulation.  Oriented x0. Extremities: Symmetric 5 x 5 power.  Moves all extremities. Anxious  and jittery on stimulation.   Data Reviewed: I have personally reviewed following labs and imaging studies  CBC: Recent Labs  Lab 08/15/20 1230 08/16/20 0918  WBC 17.3* 10.7*  NEUTROABS 15.7* 9.3*  HGB 12.1 12.4  HCT 35.3* 37.5  MCV 94.4 97.2  PLT 187 935   Basic Metabolic Panel: Recent Labs  Lab 08/15/20 1230 08/15/20 1946 08/16/20 0918  NA 133* 138 136  K 3.3* 3.6 3.4*  CL 101 105 102  CO2 24 24 23   GLUCOSE 160* 91 137*  BUN 26* 20 20  CREATININE 1.06* 0.90 0.82  CALCIUM 9.2 8.9 9.2  MG  --  2.0 2.1  PHOS  --  3.0 1.5*   GFR: Estimated Creatinine Clearance: 41.3 mL/min (by C-G formula based on SCr of 0.82 mg/dL). Liver Function Tests: Recent Labs  Lab 08/15/20 1230 08/16/20 7017  AST 28 41  ALT 17 31  ALKPHOS 42 49  BILITOT 1.2 0.6  PROT 6.2* 6.7  ALBUMIN 3.5 3.8   No results for input(s): LIPASE, AMYLASE in the last 168 hours. Recent Labs  Lab 08/15/20 1543  AMMONIA <9*   Coagulation Profile: No results for input(s): INR, PROTIME in the last 168 hours. Cardiac Enzymes: Recent Labs  Lab 08/15/20 1946  CKTOTAL 158   BNP (last 3 results) No results for input(s): PROBNP in the last 8760 hours. HbA1C: No results for input(s): HGBA1C in the last 72 hours. CBG: Recent Labs  Lab 08/16/20 0733 08/16/20 0832 08/17/20 0759 08/17/20 1219 08/17/20 1639  GLUCAP 75 152* 72 86 97   Lipid Profile: No results for input(s): CHOL, HDL, LDLCALC, TRIG, CHOLHDL, LDLDIRECT in the last 72 hours. Thyroid Function Tests: Recent Labs    08/16/20 0918  TSH 3.832   Anemia Panel: Recent Labs    08/16/20 0918  VITAMINB12 418  FOLATE 9.1   Sepsis Labs: Recent Labs  Lab 08/15/20 1317 08/15/20 1523 08/15/20 1946  PROCALCITON  --   --  14.93  LATICACIDVEN 2.5* 1.4  --     Recent Results (from the past 240 hour(s))  Urine culture     Status: None   Collection Time: 08/15/20  1:17 PM   Specimen: Urine, Random  Result Value Ref Range Status    Specimen Description   Final    URINE, RANDOM Performed at [facility], [address] [place], [place] [address]    Special Requests   Final    NONE Performed at [facility], [address] [place], [place] [address]    Culture   Final    NO GROWTH Performed at [facility], [place] [address] [place], [place] [address]    Report Status 08/16/2020 FINAL  Final  Resp Panel by RT-PCR (Flu A&B, Covid) Nasopharyngeal Swab     Status: None   Collection Time: 08/15/20  1:18 PM   Specimen: Nasopharyngeal Swab; Nasopharyngeal(NP) swabs in vial transport medium  Result Value Ref Range Status   SARS Coronavirus 2 by RT PCR NEGATIVE NEGATIVE Final    Comment: (NOTE) SARS-CoV-2 target nucleic acids are NOT DETECTED.  The SARS-CoV-2 RNA is generally detectable in upper respiratory specimens during the acute phase of infection. The lowest concentration of SARS-CoV-2 viral copies this assay can detect is 138 copies/mL. A negative result does not preclude SARS-Cov-2 infection and should not be used as the sole basis for treatment or other patient management decisions. A negative result may occur with  improper specimen collection/handling, submission of specimen other than nasopharyngeal swab, presence of viral mutation(s) within the areas targeted by this assay, and inadequate number of viral copies(<138 copies/mL). A negative result must be combined with clinical observations, patient history, and epidemiological information. The expected result is Negative.  Fact Sheet for Patients:  EntrepreneurPulse.com.au  Fact Sheet for Healthcare Providers:  IncredibleEmployment.be  This test is no t yet approved or cleared by the [place] FDA and  has been authorized for detection and/or diagnosis of SARS-CoV-2 by FDA under an Emergency Use Authorization (EUA). This EUA will remain  in effect (meaning this test can be used) for  the duration of the COVID-19 declaration under Section 564(b)(1) of the Act, 21 U.S.C.section 360bbb-3(b)(1), unless the authorization is terminated  or revoked sooner.       Influenza A by PCR NEGATIVE NEGATIVE Final  Influenza B by PCR NEGATIVE NEGATIVE Final    Comment: (NOTE) The Xpert Xpress SARS-CoV-2/FLU/RSV plus assay is intended as an aid in the diagnosis of influenza from Nasopharyngeal swab specimens and should not be used as a sole basis for treatment. Nasal washings and aspirates are unacceptable for Xpert Xpress SARS-CoV-2/FLU/RSV testing.  Fact Sheet for Patients: EntrepreneurPulse.com.au  Fact Sheet for Healthcare Providers: IncredibleEmployment.be  This test is not yet approved or cleared by the [place] FDA and has been authorized for detection and/or diagnosis of SARS-CoV-2 by FDA under an Emergency Use Authorization (EUA). This EUA will remain in effect (meaning this test can be used) for the duration of the COVID-19 declaration under Section 564(b)(1) of the Act, 21 U.S.C. section 360bbb-3(b)(1), unless the authorization is terminated or revoked.  Performed at [facility], [address] [place], [place] [address]   Blood Culture (routine x 2)     Status: None (Preliminary result)   Collection Time: 08/15/20  2:04 PM   Specimen: Right Antecubital; Blood  Result Value Ref Range Status   Specimen Description   Final    RIGHT ANTECUBITAL BLOOD Performed at [facility], [place] [address] [place], [place] [address]    Special Requests   Final    BOTTLES DRAWN AEROBIC AND ANAEROBIC Blood Culture adequate volume Performed at [facility], [address] [place], [place] [address]    Culture   Final    NO GROWTH 2 DAYS Performed at [facility], [place] [address], [place], [place] [address]    Report Status PENDING  Incomplete         Radiology Studies: No results  found.      Scheduled Meds: . aspirin EC  81 mg Oral QPM  . buPROPion  300 mg Oral Daily  . donepezil  5 mg Oral QHS  . escitalopram  10 mg Oral Daily  . feeding supplement  1 Container Oral BID BM  . [START ON 08/18/2020] feeding supplement  237 mL Oral Q24H  . multivitamin  15 mL Oral Daily  . pantoprazole  40 mg Oral Daily  . QUEtiapine  25 mg Oral QHS  . senna  1 tablet Oral BID   Continuous Infusions: . sodium chloride    . acyclovir    . ampicillin (OMNIPEN) IV    . cefTRIAXone (ROCEPHIN)  IV    . vancomycin       LOS: 2 days    Time spent: 30 minutes    Barb Merino, MD [facility] Pager [phone]

## 2020-08-17 NOTE — Progress Notes (Signed)
SLP Cancellation Note  Patient Details Name: April Decker MRN: [id number] DOB: 1940-11-22   Cancelled treatment:       Reason Eval/Treat Not Completed: Patient's level of consciousness. Patient's husband and her pastor from church both present in room. Patient sleeping, appeared comfortable and per patient she has been sleeping all day. She briefly alerted when husband provided some tactile stimulation, but did not maintain alertness. Will attempt next 1-2 dates.    Sonia Baller, MA, CCC-SLP Speech Therapy

## 2020-08-17 NOTE — Plan of Care (Signed)
°  Problem: Education: Goal: Knowledge of General Education information will improve Description: Including pain rating scale, medication(s)/side effects and non-pharmacologic comfort measures 08/17/2020 1825 by Karalee Height D, RN Outcome: Progressing 08/17/2020 1824 by Karalee Height D, RN Outcome: Progressing   Problem: Health Behavior/Discharge Planning: Goal: Ability to manage health-related needs will improve 08/17/2020 1825 by Royetta Car, RN Outcome: Progressing 08/17/2020 1824 by Karalee Height D, RN Outcome: Progressing   Problem: Clinical Measurements: Goal: Ability to maintain clinical measurements within normal limits will improve 08/17/2020 1825 by Royetta Car, RN Outcome: Progressing 08/17/2020 1824 by Karalee Height D, RN Outcome: Progressing Goal: Will remain free from infection 08/17/2020 1825 by Karalee Height D, RN Outcome: Progressing 08/17/2020 1824 by Karalee Height D, RN Outcome: Progressing Goal: Diagnostic test results will improve 08/17/2020 1825 by Royetta Car, RN Outcome: Progressing 08/17/2020 1824 by Karalee Height D, RN Outcome: Progressing Goal: Respiratory complications will improve 08/17/2020 1825 by Karalee Height D, RN Outcome: Progressing 08/17/2020 1824 by Karalee Height D, RN Outcome: Progressing Goal: Cardiovascular complication will be avoided 08/17/2020 1825 by Karalee Height D, RN Outcome: Progressing 08/17/2020 1824 by Karalee Height D, RN Outcome: Progressing   Problem: Activity: Goal: Risk for activity intolerance will decrease 08/17/2020 1825 by Karalee Height D, RN Outcome: Progressing 08/17/2020 1824 by Karalee Height D, RN Outcome: Progressing   Problem: Nutrition: Goal: Adequate nutrition will be maintained 08/17/2020 1825 by Karalee Height D, RN Outcome: Progressing 08/17/2020 1824 by Karalee Height D, RN Outcome: Progressing   Problem: Coping: Goal: Level of anxiety will decrease 08/17/2020  1825 by Karalee Height D, RN Outcome: Progressing 08/17/2020 1824 by Karalee Height D, RN Outcome: Progressing   Problem: Elimination: Goal: Will not experience complications related to bowel motility 08/17/2020 1825 by Karalee Height D, RN Outcome: Progressing 08/17/2020 1824 by Karalee Height D, RN Outcome: Progressing Goal: Will not experience complications related to urinary retention 08/17/2020 1825 by Royetta Car, RN Outcome: Progressing 08/17/2020 1824 by Karalee Height D, RN Outcome: Progressing   Problem: Safety: Goal: Ability to remain free from injury will improve 08/17/2020 1825 by Royetta Car, RN Outcome: Progressing 08/17/2020 1824 by Karalee Height D, RN Outcome: Progressing   Problem: Skin Integrity: Goal: Risk for impaired skin integrity will decrease 08/17/2020 1825 by Karalee Height D, RN Outcome: Progressing 08/17/2020 1824 by Karalee Height D, RN Outcome: Progressing   Problem: Urinary Elimination: Goal: Signs and symptoms of infection will decrease 08/17/2020 1825 by Karalee Height D, RN Outcome: Progressing 08/17/2020 1824 by Royetta Car, RN Outcome: Progressing

## 2020-08-18 ENCOUNTER — Inpatient Hospital Stay (HOSPITAL_COMMUNITY): Payer: Medicare Other

## 2020-08-18 LAB — CBC WITH DIFFERENTIAL/PLATELET
Abs Immature Granulocytes: 0.05 10*3/uL (ref 0.00–0.07)
Basophils Absolute: 0 10*3/uL (ref 0.0–0.1)
Basophils Relative: 1 %
Eosinophils Absolute: 0.1 10*3/uL (ref 0.0–0.5)
Eosinophils Relative: 1 %
HCT: 37.9 % (ref 36.0–46.0)
Hemoglobin: 12.6 g/dL (ref 12.0–15.0)
Immature Granulocytes: 1 %
Lymphocytes Relative: 8 %
Lymphs Abs: 0.7 10*3/uL (ref 0.7–4.0)
MCH: 31.3 pg (ref 26.0–34.0)
MCHC: 33.2 g/dL (ref 30.0–36.0)
MCV: 94.3 fL (ref 80.0–100.0)
Monocytes Absolute: 0.9 10*3/uL (ref 0.1–1.0)
Monocytes Relative: 11 %
Neutro Abs: 6.8 10*3/uL (ref 1.7–7.7)
Neutrophils Relative %: 78 %
Platelets: 214 10*3/uL (ref 150–400)
RBC: 4.02 MIL/uL (ref 3.87–5.11)
RDW: 12.4 % (ref 11.5–15.5)
WBC: 8.6 10*3/uL (ref 4.0–10.5)
nRBC: 0 % (ref 0.0–0.2)

## 2020-08-18 LAB — CSF CELL COUNT WITH DIFFERENTIAL
RBC Count, CSF: 510 /mm3 — ABNORMAL HIGH
Tube #: 1
WBC, CSF: 1 /mm3 (ref 0–5)

## 2020-08-18 LAB — PROTEIN, CSF: Total  Protein, CSF: 48 mg/dL — ABNORMAL HIGH (ref 15–45)

## 2020-08-18 LAB — BASIC METABOLIC PANEL
Anion gap: 10 (ref 5–15)
BUN: 13 mg/dL (ref 8–23)
CO2: 24 mmol/L (ref 22–32)
Calcium: 8.6 mg/dL — ABNORMAL LOW (ref 8.9–10.3)
Chloride: 100 mmol/L (ref 98–111)
Creatinine, Ser: 0.75 mg/dL (ref 0.44–1.00)
GFR, Estimated: >60 mL/min (ref >60)
Glucose, Bld: 95 mg/dL (ref 70–99)
Potassium: 3.8 mmol/L (ref 3.5–5.1)
Sodium: 134 mmol/L — ABNORMAL LOW (ref 135–145)

## 2020-08-18 LAB — GLUCOSE, CSF: Glucose, CSF: 53 mg/dL (ref 40–70)

## 2020-08-18 LAB — MAGNESIUM: Magnesium: 1.8 mg/dL (ref 1.7–2.4)

## 2020-08-18 MED ORDER — LORAZEPAM 2 MG/ML IJ SOLN
0.5000 mg | Freq: Once | INTRAMUSCULAR | Status: AC
  Administered 2020-08-18: 0.5 mg via INTRAVENOUS
  Filled 2020-08-18: qty 1

## 2020-08-18 MED ORDER — DEXTROSE 5 % IV SOLN
500.0000 mg | Freq: Two times a day (BID) | INTRAVENOUS | Status: DC
  Administered 2020-08-18 – 2020-08-19 (×3): 500 mg via INTRAVENOUS
  Filled 2020-08-18 (×4): qty 10

## 2020-08-18 MED ORDER — LIP MEDEX EX OINT
TOPICAL_OINTMENT | CUTANEOUS | Status: AC
  Filled 2020-08-18: qty 7

## 2020-08-18 MED ORDER — LIDOCAINE HCL 1 % IJ SOLN
INTRAMUSCULAR | Status: AC
  Administered 2020-08-18: 20 mL
  Filled 2020-08-18: qty 20

## 2020-08-18 MED ORDER — LIP MEDEX EX OINT: TOPICAL_OINTMENT | CUTANEOUS | Status: DC | PRN

## 2020-08-18 NOTE — Care Management Important Message (Signed)
Important Message  Patient Details IM Letter given to the Patient. Name: April Decker MRN: [id number] Date of Birth: 10/04/1940   Medicare Important Message Given:  Yes     Kerin Salen 08/18/2020, 10:38 AM

## 2020-08-18 NOTE — Progress Notes (Signed)
SLP Cancellation Note  Patient Details Name: April Decker MRN: [id number] DOB: 03-Apr-1941   Cancelled treatment:       Reason Eval/Treat Not Completed: Other (comment) (pt is s/p lumbar puncture and has to have [place] flat at this time)  Kathleen Lime, MS [place] Office [phone] Pager [phone]    April Decker 08/18/2020, 1:37 PM

## 2020-08-18 NOTE — Progress Notes (Signed)
Palliative care progress note  Chart reviewed and discussed case with Dr. Sloan Leiter.  I met briefly with patient and her husband.  She is much more awake and interactive this evening.  She ate part of her dinner and drank a boost and breeze.    I discussed with her husband and plan is to see how she continues to do overnight and reassess her tomorrow morning prior to discussion about long term GOC.    Plan for meeting tomorrow at 10:30 AM  Micheline Rough, MD [place] Team [phone]  NO CHARGE NOTE

## 2020-08-18 NOTE — Progress Notes (Signed)
PROGRESS NOTE    April Decker  ESP:[id number] DOB: 10/28/1940 DOA: 08/15/2020 PCP: Haywood Pao, MD    Brief Narrative:  79 year old female with history of chronic A. fib, hypertension and advanced dementia brought to ER with about 4 days of weakness, lethargic and being poorly responsive. Reportedly at baseline she could ambulate and keep up simple conversation.  Recently worsening symptoms.  In the emergency room, hemodynamically stable.  Noted to have acute kidney injury.  WBC count 17.  Evidence of UTI.  Lactic acid 2.5.  Mildly elevated troponin.  Treated with IV fluids and Rocephin and admitted to the hospital.  Subjective:  11/29, remained afebrile but mostly agitated in the daytime, symptoms responded to Haldol and Seroquel. 11/30, remained lethargic.  She started having fever in the afternoon. Blood cultures and urine cultures from 11/28 -.  Chest x-ray and CT scan abdomen pelvis with no source of infection. 12/1, overnight febrile 101.  Slightly more awake today.  Remains extremely debilitated.  Repeat chest x-ray with possible atelectasis left lower lobe.   Assessment & Plan:   Active Problems:   Atrial fibrillation (HCC)   Hypertension   Alzheimer's dementia without behavioral disturbance (HCC)   GERD (gastroesophageal reflux disease)   AMS (altered mental status)   AKI (acute kidney injury) ([place])   Sepsis ([place])   Dehydration   Protein-calorie malnutrition, severe  Acute metabolic encephalopathy in the setting of underlying advanced dementia: Mostly multifactorial secondary to infection, dehydration.  Treated with IV fluids.  No focal deficits.  Fever episode/altered mentation: Initially thought to be from UTI.  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 Blood cultures 11/28 -, blood cultures 11/30 -. Urine culture 11/28 normal. Chest x-ray 07/2017 normal.  Chest x-ray 11/30 with possible left lower  lobe atelectasis. WC count normal. Procalcitonin 14. Difficult localizing symptoms due to dementia. Broaden antibiotic coverage with vancomycin, ampicillin, continue Rocephin. We will cover with acyclovir for meningeal encephalitis. Lumbar puncture today.  Will check bacterial as well as HSV serology.  Acute kidney injury: Treated with IV fluids with improvement.    Hypokalemia/hypophosphatemia: Replaced.  Chronic A. fib: Rate controlled.  In sinus rhythm today.  Tachycardic but sinus rhythm.  Hypophosphatemia: Replaced aggressively.  Dementia with behavioral disturbances: All-time fall precautions.  On Aricept and Wellbutrin.  Patient continues to have poor response.  She has advanced dementia.  Patient has very rapidly progressive dementia and progressed for the last 2 years. Rule out acute causes.  Rule out acute infection. Consult palliative care medicine. Patient may benefit with hospice care if she does not have adequate improvement.  DVT prophylaxis: Lovenox.  Will hold today for lumbar puncture.   Code Status: DNR Family Communication: Patient's husband, Dr. Erlene Quan at the bedside 11/30. Disposition Plan: Status is: Inpatient  Remains inpatient appropriate because:Inpatient level of care appropriate due to severity of illness   Dispo: The patient is from: ALF              Anticipated d/c is to: SNF              Anticipated d/c date is: 2 to 3 days.              Patient currently is not medically stable to d/c.  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   500 mg 110 mL/hr over 60 Minutes Intravenous Every 12 hours 08/18/20 0828     08/17/20 1800  cefTRIAXone (ROCEPHIN) 2 g in sodium chloride 0.9 % 100 mL IVPB        2 g 200  mL/hr over 30 Minutes Intravenous Every 12 hours 08/17/20 1639     08/17/20 1800  acyclovir (ZOVIRAX) 500 mg in dextrose 5 % 100 mL IVPB  Status:  Discontinued       Note to Pharmacy: 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   500 mg 110 mL/hr over 60 Minutes Intravenous Every 8 hours 08/17/20 1642 08/18/20 0828   08/17/20 1800  vancomycin (VANCOCIN) IVPB 1000 mg/200 mL premix        1,000 mg 200 mL/hr over 60 Minutes Intravenous Every 24 hours 08/17/20 1649     08/17/20 1730  vancomycin (VANCOCIN) IVPB 1000 mg/200 mL premix  Status:  Discontinued        1,000 mg 200 mL/hr over 60 Minutes Intravenous  Once 08/17/20 1639 08/17/20 1649   08/17/20 1730  ampicillin (OMNIPEN) 2 g in sodium chloride 0.9 % 100 mL IVPB        2 g 300 mL/hr over 20 Minutes Intravenous Every 6 hours 08/17/20 1639     08/15/20 1400  cefTRIAXone (ROCEPHIN) 1 g in sodium chloride 0.9 % 100 mL IVPB        1 g 200 mL/hr over 30 Minutes Intravenous Every 24 hours 08/15/20 1349 08/17/20 1530         Subjective: Patient seen and examined.  Overnight mostly lethargic.  Today she was able to have simple conversation and responded some. T-max 101. Patient herself states that she is not feeling good, she cannot tell what is wrong with her.  Objective: Vitals:   08/18/20 0100 08/18/20 0428 08/18/20 0448 08/18/20 0926  BP: (!) 143/90 (!) 161/96 (!) 146/88 (!) 155/88  Pulse: 83 74  83  Resp:  20 (!) 21 (!) 22  Temp: 98.3 F (36.8 C) 98.3 F (36.8 C)  98 F (36.7 C)  TempSrc: Oral   Oral  SpO2: 92% 98%  98%  Weight:      Height:        Intake/Output Summary (Last 24 hours) at 08/18/2020 1129 Last data filed at 08/18/2020 2426 Gross per 24 hour  Intake 1543.08 ml  Output 950 ml  Net 593.08 ml   Filed Weights   08/15/20 1205 08/16/20 0409  Weight: 47.6 kg 48.6 kg    Examination:  General exam: Sick looking frail and debilitated lady laying in bed.   Follows simple commands.  Answers yes and no.   Respiratory  system: Clear to auscultation. Respiratory effort normal.  No added sounds. On room air. Cardiovascular system: S1 & S2 heard, RRR.  Tachycardic. Gastrointestinal system: Soft and nontender.   Central nervous system: Alert on stimulation. Lethargic. Extremities: Symmetric 5 x 5 power.  Moves all extremities. Anxious    Data Reviewed: I have personally reviewed following labs and imaging studies  CBC: Recent Labs  Lab 08/15/20 1230 08/16/20 0918 08/18/20 0933  WBC 17.3* 10.7* 8.6  NEUTROABS 15.7* 9.3* 6.8  HGB 12.1 12.4 12.6  HCT 35.3* 37.5 37.9  MCV 94.4 97.2 94.3  PLT 187 174 834   Basic Metabolic Panel: Recent Labs  Lab 08/15/20 1230 08/15/20 1946  08/16/20 0918 08/18/20 0933  NA 133* 138 136 134*  K 3.3* 3.6 3.4* 3.8  CL 101 105 102 100  CO2 24 24 23 24   GLUCOSE 160* 91 137* 95  BUN 26* 20 20 13   CREATININE 1.06* 0.90 0.82 0.75  CALCIUM 9.2 8.9 9.2 8.6*  MG  --  2.0 2.1 1.8  PHOS  --  3.0 1.5*  --    GFR: Estimated Creatinine Clearance: 42.3 mL/min (by C-G formula based on SCr of 0.75 mg/dL). Liver Function Tests: Recent Labs  Lab 08/15/20 1230 08/16/20 0918  AST 28 41  ALT 17 31  ALKPHOS 42 49  BILITOT 1.2 0.6  PROT 6.2* 6.7  ALBUMIN 3.5 3.8   No results for input(s): LIPASE, AMYLASE in the last 168 hours. Recent Labs  Lab 08/15/20 1543  AMMONIA <9*   Coagulation Profile: No results for input(s): INR, PROTIME in the last 168 hours. Cardiac Enzymes: Recent Labs  Lab 08/15/20 1946  CKTOTAL 158   BNP (last 3 results) No results for input(s): PROBNP in the last 8760 hours. HbA1C: No results for input(s): HGBA1C in the last 72 hours. CBG: Recent Labs  Lab 08/16/20 0733 08/16/20 0832 08/17/20 0759 08/17/20 1219 08/17/20 1639  GLUCAP 75 152* 72 86 97   Lipid Profile: No results for input(s): CHOL, HDL, LDLCALC, TRIG, CHOLHDL, LDLDIRECT in the last 72 hours. Thyroid Function Tests: Recent Labs    08/16/20 0918  TSH 3.832   Anemia  Panel: Recent Labs    08/16/20 0918  VITAMINB12 418  FOLATE 9.1   Sepsis Labs: Recent Labs  Lab 08/15/20 1317 08/15/20 1523 08/15/20 1946  PROCALCITON  --   --  14.93  LATICACIDVEN 2.5* 1.4  --     Recent Results (from the past 240 hour(s))  Urine culture     Status: None   Collection Time: 08/15/20  1:17 PM   Specimen: Urine, Random  Result Value Ref Range Status   Specimen Description   Final    URINE, RANDOM Performed at [facility], [address] [place], [place] [address]    Special Requests   Final    NONE Performed at [facility], [address] [place], [place] [address]    Culture   Final    NO GROWTH Performed at [facility], [place] [address] [place], [place] [address]    Report Status 08/16/2020 FINAL  Final  Resp Panel by RT-PCR (Flu A&B, Covid) Nasopharyngeal Swab     Status: None   Collection Time: 08/15/20  1:18 PM   Specimen: Nasopharyngeal Swab; Nasopharyngeal(NP) swabs in vial transport medium  Result Value Ref Range Status   SARS Coronavirus 2 by RT PCR NEGATIVE NEGATIVE Final    Comment: (NOTE) SARS-CoV-2 target nucleic acids are NOT DETECTED.  The SARS-CoV-2 RNA is generally detectable in upper respiratory specimens during the acute phase of infection. The lowest concentration of SARS-CoV-2 viral copies this assay can detect is 138 copies/mL. A negative result does not preclude SARS-Cov-2 infection and should not be used as the sole basis for treatment or other patient management decisions. A negative result may occur with  improper specimen collection/handling, submission of specimen other than nasopharyngeal swab, presence of viral mutation(s) within the areas targeted by this assay, and inadequate number of viral copies(<138 copies/mL). A negative result must be combined with clinical observations, patient history, and epidemiological information. The expected result is Negative.  Fact Sheet for  Patients:   EntrepreneurPulse.com.au  Fact Sheet for Healthcare Providers:  IncredibleEmployment.be  This test is no t yet approved or cleared by the [place] FDA and  has been authorized for detection and/or diagnosis of SARS-CoV-2 by FDA under an Emergency Use Authorization (EUA). This EUA will remain  in effect (meaning this test can be used) for the duration of the COVID-19 declaration under Section 564(b)(1) of the Act, 21 U.S.C.section 360bbb-3(b)(1), unless the authorization is terminated  or revoked sooner.       Influenza A by PCR NEGATIVE NEGATIVE Final   Influenza B by PCR NEGATIVE NEGATIVE Final    Comment: (NOTE) The Xpert Xpress SARS-CoV-2/FLU/RSV plus assay is intended as an aid in the diagnosis of influenza from Nasopharyngeal swab specimens and should not be used as a sole basis for treatment. Nasal washings and aspirates are unacceptable for Xpert Xpress SARS-CoV-2/FLU/RSV testing.  Fact Sheet for Patients: EntrepreneurPulse.com.au  Fact Sheet for Healthcare Providers: IncredibleEmployment.be  This test is not yet approved or cleared by the [place] FDA and has been authorized for detection and/or diagnosis of SARS-CoV-2 by FDA under an Emergency Use Authorization (EUA). This EUA will remain in effect (meaning this test can be used) for the duration of the COVID-19 declaration under Section 564(b)(1) of the Act, 21 U.S.C. section 360bbb-3(b)(1), unless the authorization is terminated or revoked.  Performed at [facility], [address] [place], [place] [address]   Blood Culture (routine x 2)     Status: None (Preliminary result)   Collection Time: 08/15/20  2:04 PM   Specimen: Right Antecubital; Blood  Result Value Ref Range Status   Specimen Description   Final    RIGHT ANTECUBITAL BLOOD Performed at [facility], [place] [address], [place], [place] [address]     Special Requests   Final    BOTTLES DRAWN AEROBIC AND ANAEROBIC Blood Culture adequate volume Performed at [facility], [address] [place], [place] [address]    Culture   Final    NO GROWTH 3 DAYS Performed at [facility], [place] [address], [place], [place] [address]    Report Status PENDING  Incomplete  Culture, blood (routine x 2)     Status: None (Preliminary result)   Collection Time: 08/17/20  3:41 PM   Specimen: BLOOD RIGHT HAND  Result Value Ref Range Status   Specimen Description   Final    BLOOD RIGHT HAND Performed at [place] [address], [place], [place] [address]    Special Requests   Final    BOTTLES DRAWN AEROBIC AND ANAEROBIC Blood Culture adequate volume Performed at [place] [address] [place], [place] [address]    Culture   Final    NO GROWTH < 24 HOURS Performed at [place] [facility] [address] [place],  [address]    Report Status PENDING  Incomplete  Culture, blood (routine x 2)     Status: None (Preliminary result)   Collection Time: 08/17/20  3:41 PM   Specimen: BLOOD LEFT HAND  Result Value Ref Range Status   Specimen Description   Final    BLOOD LEFT HAND Performed at [place] [address] [place],  [address]    Special Requests   Final    BOTTLES DRAWN AEROBIC AND ANAEROBIC Blood Culture adequate volume Performed at [place] [address] [place],  [place] [address]    Culture   Final    NO GROWTH < 24 HOURS Performed at [facility], [place] [address], [place], [place] [address]    Report Status PENDING  Incomplete         Radiology Studies: DG CHEST PORT 1 VIEW  Result Date: 08/17/2020 CLINICAL DATA:  4 day history of weakness. EXAM: PORTABLE CHEST 1 VIEW COMPARISON:  08/15/2020 FINDINGS: Lungs are hyperexpanded. Streaky opacity at the left base likely atelectasis although superimposed  component of pneumonia cannot be excluded. The cardiopericardial silhouette is within normal limits for size. Bones are diffusely demineralized. Telemetry leads overlie the chest. IMPRESSION: Hyperexpansion with atelectasis or infiltrate at the left base. Electronically Signed   By: Misty Stanley M.D.   On: 08/17/2020 16:52        Scheduled Meds: . aspirin EC  81 mg Oral QPM  . buPROPion  300 mg Oral Daily  . donepezil  5 mg Oral QHS  . escitalopram  10 mg Oral Daily  . feeding supplement  1 Container Oral BID BM  . feeding supplement  237 mL Oral Q24H  . multivitamin  15 mL Oral Daily  . pantoprazole  40 mg Oral Daily  . QUEtiapine  25 mg Oral QHS  . senna  1 tablet Oral BID   Continuous Infusions: . sodium chloride 100 mL/hr at 08/18/20 0334  . acyclovir    . ampicillin (OMNIPEN) IV 2 g (08/18/20 0448)  . cefTRIAXone (ROCEPHIN)  IV 2 g (08/18/20 0722)  . vancomycin 1,000 mg (08/17/20 1801)     LOS: 3 days    Time spent: 30 minutes    Barb Merino, MD [facility] Pager [phone]

## 2020-08-18 NOTE — Progress Notes (Signed)
Patient is more alert, took PO medication, drank a boost and ensure and ate 25% of her supper.

## 2020-08-18 NOTE — Progress Notes (Signed)
Patient ID: April Decker, female   DOB: 1941/03/17, 79 y.o.   MRN: [id number] CLINICAL DATA: [Dementia.] EXAM: DIAGNOSTIC LUMBAR PUNCTURE UNDER FLUOROSCOPIC GUIDANCE FLUOROSCOPY TIME: Fluoroscopy Time: [0 minutes 45 seconds] Radiation Exposure Index (if provided by the fluoroscopic device): [15.5 mGy] Number of Acquired Spot Images: [0] PROCEDURE: Informed consent was obtained from the patient prior to the procedure, including potential complications of headache, allergy, and pain. With the patient prone, the lower back was prepped with alcohol due to iodine/contrast allergy. 1% Lidocaine was used for local anesthesia. Lumbar puncture was performed at the [L3-4] level using a [20] gauge needle with return of [blood tinged CSF which immediately cleared, with an opening pressure of [less than 10] cm water. [7] ml of CSF were obtained for laboratory studies. The patient tolerated the procedure well and there were no apparent complications.  IMPRESSION: [Successful lumbar puncture under fluoroscopy.]

## 2020-08-19 DIAGNOSIS — Z7189 Other specified counseling: Secondary | ICD-10-CM

## 2020-08-19 DIAGNOSIS — Z515 Encounter for palliative care: Secondary | ICD-10-CM

## 2020-08-19 LAB — CYTOLOGY - NON PAP

## 2020-08-19 MED ORDER — LABETALOL HCL 5 MG/ML IV SOLN
10.0000 mg | Freq: Once | INTRAVENOUS | Status: AC
  Administered 2020-08-19: 10 mg via INTRAVENOUS
  Filled 2020-08-19: qty 4

## 2020-08-19 MED ORDER — ENOXAPARIN SODIUM 40 MG/0.4ML ~~LOC~~ SOLN
40.0000 mg | Freq: Every day | SUBCUTANEOUS | Status: DC
  Filled 2020-08-19: qty 0.4

## 2020-08-19 NOTE — TOC Progression Note (Signed)
Transition of Care [facility]) - Progression Note    Patient Details  Name: April Decker MRN: [id number] Date of Birth: 03-28-1941  Transition of Care [facility]) CM/SW Contact  Ross Ludwig, [place] Phone Number: 08/19/2020, 4:52 PM  Clinical Narrative:     CSW spoke to [place] at [facility], she can accept patient tomorrow if she is medically ready for discharge.  Luellen Pucker at [facility] said patient will not need a new Covid test.  Patient has been vaccinated with Moderna on 09/30/19, 10/28/19, and received booster on 04/26/2020 per [facility] clinical note dated August 18, 2020.  CSW faxed copy of vaccination record to [facility].   Expected Discharge Plan: [place] Barriers to Discharge: Continued Medical Work up  Expected Discharge Plan and Services Expected Discharge Plan: [place] arrangements for the past 2 months: [place]                                       Social Determinants of Health (SDOH) Interventions    Readmission Risk Interventions No flowsheet data found.

## 2020-08-19 NOTE — Progress Notes (Signed)
Occupational Therapy Progress Note  Patient seated in chair upon arrival, agreeable to brushing teeth at sink. Patient initially min G to stand from chair with cues for hand placement on walker. Patient require hand over hand assist for set up to brush teeth. In standing patient became less participatory, closing her eyes when asked if dizzy stated "yes." Patient max A to sit into recliner with cues to keep eyes open. Patient's spouse reports she "goes in and out" a lot. Patient require max A for thoroughness to brush her teeth and set up to wash her face with increased participation at end of session. Recommend continued acute OT services in order to maximize patient activity tolerance, balance, safety in order to reduce caregiver burden.     08/19/20 1400  OT Visit Information  Last OT Received On 08/19/20  Assistance Needed +2 (safety)  History of Present Illness Patient is an 79 y.o. female with PMH: atrial fibrillation, HTN, dementia, constipation, GERD. She presented from [facility]) with her husband. (Husband stated that he does all her care and that he has been hiding her dementia from the community). She presented to hospital with 48 hours of weakness and AMS, abdominal pain urinary incontinence.  Pt's husband relays 3 months of gradual decline and 5 days of rapid decline.  Precautions  Precautions Fall  Pain Assessment  Pain Assessment No/denies pain  Cognition  Arousal/Alertness Awake/alert  Behavior During Therapy WFL for tasks assessed/performed  Overall Cognitive Status History of cognitive impairments - at baseline  General Comments patient able to converse with OT, had moment of semi unresponsiveness at sink with patient closing her eyes and had to sit patient quickly into chair. patient became more interactive and able to continue to participate in session   ADL  Overall ADL's  Needs assistance/impaired  Grooming Oral care;Wash/dry hands;Wash/dry  face;Moderate assistance;Sitting;Standing  Grooming Details (indicate cue type and reason) standing at sink side patient requiring hand over hand assistance to put tooth paste on brush, patient then became less responsive and closing her eyes, did state "yes" when asked if she was dizzy. patient quickly transferred to recliner. patient's spouse states she does this "she goes in and out."  patient require max A to complete brushing her teeth. patient became more interactive and was set up for washing her face.   Toilet Transfer Maximal assistance;Stand-pivot;Cueing for safety;Cueing for sequencing;RW;Min guard  Toilet Transfer Details (indicate cue type and reason) to stand from recliner patient min G assist for safety with balance and cues for hand placement on walker. Once patient became less responsive required max A to safely sit into recliner chair   Functional mobility during ADLs Minimal assistance;Maximal assistance;Rolling walker;Cueing for sequencing;Cueing for safety (see toilet transfer for details re: assist level)  General ADL Comments patient's spouse reports patient is more alert today, patient continues to be a high fall risk with fluctuating balance, alertness and physical assistance needed   Bed Mobility  General bed mobility comments OOB  Balance  Overall balance assessment Needs assistance  Sitting-balance support Feet supported  Sitting balance-Leahy Scale Fair  Standing balance support Bilateral upper extremity supported  Standing balance-Leahy Scale Poor  Standing balance comment reliant on external support and min to mod from OT  Transfers  Overall transfer level Needs assistance  Equipment used Rolling walker (2 wheeled)  Transfers Sit to/from Stand  Sit to Stand Min assist;Max assist  General transfer comment please see toilet transfer in ADL section re: fluctuating assist levels  for transfers. high fall risk   General Comments  General comments (skin integrity,  edema, etc.) unable to take BP in standing due to safety, once returned to chair patient alertness increase and able to participate in remainder of session  OT - End of Session  Equipment Utilized During Treatment Gait belt;Rolling walker  Activity Tolerance Patient limited by fatigue  Patient left in chair;with call bell/phone within reach;with chair alarm set;with family/visitor present  Nurse Communication Mobility status  OT Assessment/Plan  OT Plan Discharge plan remains appropriate  OT Visit Diagnosis Unsteadiness on feet (R26.81);Cognitive communication deficit (R41.841);Feeding difficulties (R63.3);Muscle weakness (generalized) (M62.81);Other symptoms and signs involving cognitive function  Symptoms and signs involving cognitive functions Other cerebrovascular disease  OT Frequency (ACUTE ONLY) Min 2X/week  Follow Up Recommendations SNF  OT Equipment 3 in 1 bedside commode;Other (comment) (also defer to post acute care)  AM-PAC OT "6 Clicks" Daily Activity Outcome Measure (Version 2)  Help from another person eating meals? 3  Help from another person taking care of personal grooming? 2  Help from another person toileting, which includes using toliet, bedpan, or urinal? 1  Help from another person bathing (including washing, rinsing, drying)? 2  Help from another person to put on and taking off regular upper body clothing? 2  Help from another person to put on and taking off regular lower body clothing? 1  6 Click Score 11  OT Goal Progression  Progress towards OT goals Progressing toward goals  Acute Rehab OT Goals  Patient Stated Goal Per spouse: To be able to resume safely caring for pt at home.  OT Goal Formulation Patient unable to participate in goal setting  Time For Goal Achievement 08/30/20  Potential to Achieve Goals Good  ADL Goals  Pt Will Perform Eating with set-up;with supervision;sitting  Pt Will Perform Grooming sitting;standing;with set-up;with supervision  Pt  Will Perform Upper Body Bathing with supervision;sitting  Pt Will Perform Lower Body Bathing sitting/lateral leans;with min guard assist  Pt Will Perform Upper Body Dressing with set-up;sitting  Pt Will Perform Lower Body Dressing with set-up;with supervision;sitting/lateral leans;sit to/from stand  Pt Will Transfer to Toilet with supervision;ambulating;regular height toilet;bedside commode;grab bars  Additional ADL Goal #1 Pt will demonstrate improved sitting balance from poor to fair or better in order to perform seated ADLs with increased safety.  OT Time Calculation  OT Start Time (ACUTE ONLY) 1240  OT Stop Time (ACUTE ONLY) 1304  OT Time Calculation (min) 24 min  OT General Charges  $OT Visit 1 Visit  OT Treatments  $Self Care/Home Management  23-37 mins   Delbert Phenix OT OT pager: [phone]

## 2020-08-19 NOTE — Progress Notes (Signed)
Physical Therapy Treatment Patient Details Name: April Decker MRN: [id number] DOB: 02-03-1941 Today's Date: 08/19/2020    History of Present Illness Patient is an 79 y.o. female with PMH: atrial fibrillation, HTN, dementia, constipation, GERD. She presented from [facility]) with her husband. (Husband stated that he does all her care and that he has been hiding her dementia from the community). She presented to hospital with 48 hours of weakness and AMS, abdominal pain urinary incontinence.  Pt's husband relays 3 months of gradual decline and 5 days of rapid decline.    PT Comments    Pt ambulated short distance in hallway today with min assist!  Pt's cognition improved, and she was able to have a simple conversation and follow simple commands.  Pt left up in recliner with spouse in room end of session.   Follow Up Recommendations  SNF     Equipment Recommendations  Rolling walker with 5" wheels    Recommendations for Other Services       Precautions / Restrictions Precautions Precautions: Fall    Mobility  Bed Mobility Overal bed mobility: Needs Assistance Bed Mobility: Supine to Sit     Supine to sit: HOB elevated;Min assist     General bed mobility comments: min assist to guide pt to EOB  Transfers Overall transfer level: Needs assistance Equipment used: Rolling walker (2 wheeled) Transfers: Sit to/from Stand Sit to Stand: Min assist;+2 safety/equipment         General transfer comment: multimodal cues for technique  Ambulation/Gait Ambulation/Gait assistance: Min assist;+2 safety/equipment Gait Distance (Feet): 120 Feet Assistive device: Rolling walker (2 wheeled) Gait Pattern/deviations: Step-through pattern;Decreased stride length;Shuffle;Narrow base of support     General Gait Details: pt with very short narrow steps; required min assist for stability and also for negotiating RW   Stairs             Wheelchair Mobility     Modified Rankin (Stroke Patients Only)       Balance                                            Cognition Arousal/Alertness: Awake/alert Behavior During Therapy: WFL for tasks assessed/performed Overall Cognitive Status: Impaired/Different from baseline Area of Impairment: Orientation;Following commands;Safety/judgement;Awareness;Problem solving;Memory;Attention                 Orientation Level: Disoriented to;Place;Time;Situation Current Attention Level: Focused Memory: Decreased recall of precautions;Decreased short-term memory Following Commands: Follows one step commands with increased time;Follows one step commands consistently Safety/Judgement: Decreased awareness of safety;Decreased awareness of deficits   Problem Solving: Slow processing;Difficulty sequencing;Requires verbal cues;Requires tactile cues General Comments: pt with improved cognition today compared to last visit; pt not orientated but able to have simple conversation and answer simple questions.      Exercises      General Comments        Pertinent Vitals/Pain Pain Assessment: No/denies pain Pain Intervention(s): Monitored during session;Repositioned    Home Living                      Prior Function            PT Goals (current goals can now be found in the care plan section) Progress towards PT goals: Progressing toward goals    Frequency    Min 2X/week  PT Plan Current plan remains appropriate    Co-evaluation              AM-PAC PT "6 Clicks" Mobility   Outcome Measure  Help needed turning from your back to your side while in a flat bed without using bedrails?: A Little Help needed moving from lying on your back to sitting on the side of a flat bed without using bedrails?: A Little Help needed moving to and from a bed to a chair (including a wheelchair)?: A Little Help needed standing up from a chair using your arms (e.g., wheelchair  or bedside chair)?: A Little Help needed to walk in hospital room?: A Little Help needed climbing 3-5 steps with a railing? : A Lot 6 Click Score: 17    End of Session Equipment Utilized During Treatment: Gait belt Activity Tolerance: Patient tolerated treatment well Patient left: in chair;with call bell/phone within reach;with chair alarm set;with family/visitor present Nurse Communication: Mobility status PT Visit Diagnosis: Difficulty in walking, not elsewhere classified (R26.2);Unsteadiness on feet (R26.81)     Time: 2831-5176 PT Time Calculation (min) (ACUTE ONLY): 20 min  Charges:  $Gait Training: 8-22 mins                    Arlyce Dice, DPT Acute Rehabilitation Services Pager: [phone] Office: [phone]  York Ram E 08/19/2020, 1:16 PM

## 2020-08-19 NOTE — Progress Notes (Signed)
   08/19/20 0620  Vitals  BP (!) 184/98  BP Location Left Arm  BP Method Manual  Patient Position (if appropriate) Lying  ECG Heart Rate 71  Resp 16  MEWS COLOR  MEWS Score Color Green  MEWS Score  MEWS Temp 0  MEWS Systolic 0  MEWS Pulse 0  MEWS RR 0  MEWS LOC 0  MEWS Score 0   No PRN meds at this time. MD notified. Awaiting new orders.

## 2020-08-19 NOTE — Progress Notes (Signed)
PROGRESS NOTE    April Decker  ERX:[id number] DOB: 11/05/1940 DOA: 08/15/2020 PCP: Haywood Pao, MD    Brief Narrative:  79 year old female with history of chronic A. fib, hypertension and advanced dementia brought to ER with about 4 days of weakness, lethargy and being poorly responsive. Reportedly at baseline she could ambulate and keep up simple conversations.  Recently worsening symptoms.  In the emergency room, hemodynamically stable.  Noted to have acute kidney injury.  WBC count 17.  Evidence of UTI.  Lactic acid 2.5.  Mildly elevated troponin.  Treated with IV fluids and Rocephin and admitted to the hospital.  Subjective:  11/29, remained afebrile but mostly agitated in the daytime, symptoms responded to Haldol and Seroquel. 11/30, remained lethargic.  She started having fever in the afternoon. Blood cultures and urine cultures from 11/28 -.  Chest x-ray and CT scan abdomen pelvis with no source of infection. 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  Antibiotic discontinued on 12/1. 12/2, more awake.  Able to keep up conversation.  She was able to recall her life events and talk to her husband.  Afebrile last 24 hours.  Lumbar puncture negative for bacterial infection.  HSV PCR pending.    Assessment & Plan:   Active Problems:   Atrial fibrillation (HCC)   Hypertension   Alzheimer's dementia without behavioral disturbance (HCC)   GERD (gastroesophageal reflux disease)   AMS (altered mental status)   AKI (acute kidney injury) ([place])   Sepsis ([place])   Dehydration   Protein-calorie malnutrition, severe  Acute metabolic encephalopathy in the setting of underlying advanced dementia: Mostly multifactorial secondary to infection, dehydration.  Treated with IV fluids.  No focal deficits.  Continue IV fluids.  Will encourage oral fluid intake.  Fever episode/altered mentation:  Initially thought to be from UTI.  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  Blood cultures 11/28 no growth, blood cultures 11/30 no growth Urine culture 11/28 normal. Chest x-ray 07/2017 normal.  Chest x-ray 11/30 with possible left lower lobe atelectasis. WBC count normal. Procalcitonin 14. Difficult localizing symptoms due to dementia. Lumbar puncture with no evidence of bacterial meningitis.  Broad-spectrum antibiotic discontinued.   Continue HSV coverage with acyclovir until PCR results available.  Continue Rocephin for presumed pneumonia.    Acute kidney injury: Treated with IV fluids with improvement.  Normalized.  Hypokalemia/hypophosphatemia: Replaced.  Normalized.  Chronic A. fib: Rate controlled.  In sinus rhythm today.  Rate controlled.  Hypophosphatemia: Replaced aggressively.  Advance care planning /dementia with behavioral disturbances: All-time fall precautions.  On Aricept and Wellbutrin.  Patient has very rapidly progressive dementia and progressed for the last 2 years. Rule out acute causes.  Rule out acute infection. She had better symptom control at night with Seroquel 25 mg that was started in the hospital. CODE STATUS and MOST level of care discussed, educated, instructions provided to patient's husband. Patient is DNR/DNI.  Will follow up MOST form before discharge. Patient has some clinical improvement today, she will be able to go to a skilled nursing facility with palliative medicine team to continue to follow-up.   DVT prophylaxis: Lovenox.    Code Status: DNR Family Communication: Patient's husband, Dr. Erlene Quan at the bedside  Disposition Plan: Status is: Inpatient  Remains inpatient appropriate because:Inpatient level of care appropriate due to severity of illness   Dispo: The patient is from: ALF  Anticipated d/c is to: SNF              Anticipated d/c date is: [facility].              Patient currently is not medically stable to  d/c.  Consultants:   Palliative medicine.  Procedures:   Lumbar puncture.  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   500 mg 110 mL/hr over 60 Minutes Intravenous Every 8 hours 08/17/20 1642 08/18/20 0828   08/17/20 1800  vancomycin (VANCOCIN) IVPB 1000 mg/200 mL premix  Status:  Discontinued        1,000 mg 200 mL/hr over 60 Minutes Intravenous Every 24 hours 08/17/20 1649 08/18/20 1302   08/17/20 1730  vancomycin (VANCOCIN) IVPB 1000 mg/200 mL premix  Status:  Discontinued        1,000 mg 200 mL/hr over 60 Minutes Intravenous  Once 08/17/20 1639 08/17/20 1649   08/17/20 1730  ampicillin (OMNIPEN) 2 g in sodium chloride 0.9 % 100 mL IVPB  Status:  Discontinued        2 g 300 mL/hr over 20 Minutes Intravenous Every 6 hours 08/17/20 1639 08/18/20 1302   08/15/20 1400  cefTRIAXone (ROCEPHIN) 1 g in sodium chloride 0.9 % 100 mL IVPB        1 g 200 mL/hr over 30 Minutes Intravenous Every 24 hours 08/15/20 1349 08/17/20 1530         Subjective: Patient seen and examined.  No overnight events.  Since last night she has been more awake and able to eat.  Afebrile overnight. Surprisingly, she is more alert oriented and able to have normal conversation.  At her baseline she is forgetful and not oriented to time and place.  Husband at the bedside. Blood pressure is elevated, however not on treatment at home.   Will monitor and treat for persistent high blood pressure only.  Objective: Vitals:   08/18/20 1257 08/18/20 2029 08/19/20 0604 08/19/20 0620  BP: (!) 167/88 129/76 (!) 186/101 (!) 184/98  Pulse: 72 96 79   Resp: 15 15  16   Temp: 98.1 F (36.7 C) 97.9 F (36.6 C) 98 F (36.7 C)   TempSrc:  Oral    SpO2: 100% 100% 97%   Weight:      Height:        Intake/Output Summary (Last 24 hours) at 08/19/2020 1327 Last data filed at 08/19/2020 4332 Gross per 24 hour  Intake 2585.24 ml  Output 1450 ml  Net 1135.24 ml   Filed Weights   08/15/20 1205 08/16/20 0409  Weight: 47.6 kg 48.6 kg    Examination:  General exam: Patient is fairly comfortable on room air. She is able to follow commands.  Keep up conversations. Frail and  debilitated. Respiratory system: Clear to auscultation. Respiratory effort normal.  No added sounds. On room air. Cardiovascular system: S1 & S2 heard, RRR.   Gastrointestinal system: Soft and nontender.   Central nervous system: Alert and awake.  Not oriented. Extremities: Symmetric 5 x 5 power.  Moves all extremities.    Data Reviewed: I have personally reviewed following labs and imaging studies  CBC: Recent Labs  Lab 08/15/20 1230 08/16/20 0918 08/18/20 0933  WBC 17.3* 10.7* 8.6  NEUTROABS 15.7* 9.3* 6.8  HGB 12.1 12.4 12.6  HCT 35.3* 37.5 37.9  MCV 94.4 97.2 94.3  PLT 187 174 790   Basic Metabolic Panel: Recent Labs  Lab 08/15/20 1230 08/15/20 1946 08/16/20 0918 08/18/20 0933  NA 133* 138 136 134*  K 3.3* 3.6 3.4* 3.8  CL 101 105 102 100  CO2 24 24 23 24   GLUCOSE 160* 91 137* 95  BUN 26* 20 20 13   CREATININE 1.06* 0.90 0.82 0.75  CALCIUM 9.2 8.9 9.2 8.6*  MG  --  2.0 2.1 1.8  PHOS  --  3.0 1.5*  --    GFR: Estimated Creatinine Clearance: 42.3 mL/min (by C-G formula based on SCr of 0.75 mg/dL). Liver Function Tests: Recent Labs  Lab 08/15/20 1230 08/16/20 0918  AST 28 41  ALT 17 31  ALKPHOS 42 49  BILITOT 1.2 0.6  PROT  6.2* 6.7  ALBUMIN 3.5 3.8   No results for input(s): LIPASE, AMYLASE in the last 168 hours. Recent Labs  Lab 08/15/20 1543  AMMONIA <9*   Coagulation Profile: No results for input(s): INR, PROTIME in the last 168 hours. Cardiac Enzymes: Recent Labs  Lab 08/15/20 1946  CKTOTAL 158   BNP (last 3 results) No results for input(s): PROBNP in the last 8760 hours. HbA1C: No results for input(s): HGBA1C in the last 72 hours. CBG: Recent Labs  Lab 08/16/20 0733 08/16/20 0832 08/17/20 0759 08/17/20 1219 08/17/20 1639  GLUCAP 75 152* 72 86 97   Lipid Profile: No results for input(s): CHOL, HDL, LDLCALC, TRIG, CHOLHDL, LDLDIRECT in the last 72 hours. Thyroid Function Tests: No results for input(s): TSH, T4TOTAL, FREET4, T3FREE, THYROIDAB in the last 72 hours. Anemia Panel: No results for input(s): VITAMINB12, FOLATE, FERRITIN, TIBC, IRON, RETICCTPCT in the last 72 hours. Sepsis Labs: Recent Labs  Lab 08/15/20 1317 08/15/20 1523 08/15/20 1946  PROCALCITON  --   --  14.93  LATICACIDVEN 2.5* 1.4  --     Recent Results (from the past 240 hour(s))  Urine culture     Status: None   Collection Time: 08/15/20  1:17 PM   Specimen: Urine, Random  Result Value Ref Range Status   Specimen Description   Final    URINE, RANDOM Performed at [facility], [address] [place], [place] [address]    Special Requests   Final    NONE Performed at [facility], [address] [place], [place] [address]    Culture   Final    NO GROWTH Performed at [facility], [place] [address], [place],  [address]    Report Status 08/16/2020 FINAL  Final  Resp Panel by RT-PCR (Flu A&B, Covid) Nasopharyngeal Swab     Status: None   Collection Time: 08/15/20  1:18 PM   Specimen: Nasopharyngeal Swab; Nasopharyngeal(NP) swabs in vial transport medium  Result Value Ref Range Status   SARS Coronavirus 2 by RT PCR NEGATIVE NEGATIVE Final  Comment:  (NOTE) SARS-CoV-2 target nucleic acids are NOT DETECTED.  The SARS-CoV-2 RNA is generally detectable in upper respiratory specimens during the acute phase of infection. The lowest concentration of SARS-CoV-2 viral copies this assay can detect is 138 copies/mL. A negative result does not preclude SARS-Cov-2 infection and should not be used as the sole basis for treatment or other patient management decisions. A negative result may occur with  improper specimen collection/handling, submission of specimen other than nasopharyngeal swab, presence of viral mutation(s) within the areas targeted by this assay, and inadequate number of viral copies(<138 copies/mL). A negative result must be combined with clinical observations, patient history, and epidemiological information. The expected result is Negative.  Fact Sheet for Patients:  EntrepreneurPulse.com.au  Fact Sheet for Healthcare Providers:  IncredibleEmployment.be  This test is no t yet approved or cleared by the [place] FDA and  has been authorized for detection and/or diagnosis of SARS-CoV-2 by FDA under an Emergency Use Authorization (EUA). This EUA will remain  in effect (meaning this test can be used) for the duration of the COVID-19 declaration under Section 564(b)(1) of the Act, 21 U.S.C.section 360bbb-3(b)(1), unless the authorization is terminated  or revoked sooner.       Influenza A by PCR NEGATIVE NEGATIVE Final   Influenza B by PCR NEGATIVE NEGATIVE Final    Comment: (NOTE) The Xpert Xpress SARS-CoV-2/FLU/RSV plus assay is intended as an aid in the diagnosis of influenza from Nasopharyngeal swab specimens and should not be used as a sole basis for treatment. Nasal washings and aspirates are unacceptable for Xpert Xpress SARS-CoV-2/FLU/RSV testing.  Fact Sheet for Patients: EntrepreneurPulse.com.au  Fact Sheet for Healthcare  Providers: IncredibleEmployment.be  This test is not yet approved or cleared by the [place] FDA and has been authorized for detection and/or diagnosis of SARS-CoV-2 by FDA under an Emergency Use Authorization (EUA). This EUA will remain in effect (meaning this test can be used) for the duration of the COVID-19 declaration under Section 564(b)(1) of the Act, 21 U.S.C. section 360bbb-3(b)(1), unless the authorization is terminated or revoked.  Performed at [facility], [address] [place], [place] [address]   Blood Culture (routine x 2)     Status: None (Preliminary result)   Collection Time: 08/15/20  2:04 PM   Specimen: Right Antecubital; Blood  Result Value Ref Range Status   Specimen Description   Final    RIGHT ANTECUBITAL BLOOD Performed at [facility], [place] [address] [place], [place] [address]    Special Requests   Final    BOTTLES DRAWN AEROBIC AND ANAEROBIC Blood Culture adequate volume Performed at [facility], [address] [place], [place] [address]    Culture   Final    NO GROWTH 4 DAYS Performed at [facility], [place] [address] [place], [place] [address]    Report Status PENDING  Incomplete  Culture, blood (routine x 2)     Status: None (Preliminary result)   Collection Time: 08/17/20  3:41 PM   Specimen: BLOOD RIGHT HAND  Result Value Ref Range Status   Specimen Description   Final    BLOOD RIGHT HAND Performed at [facility] [address], [place], [place] [address]    Special Requests   Final    BOTTLES DRAWN AEROBIC AND ANAEROBIC Blood Culture adequate volume Performed at [facility] [address], [place], [place] [address]    Culture  Final    NO GROWTH 2 DAYS Performed at [facility], [place] [address], [place], [place] [address]    Report Status PENDING  Incomplete  Culture, blood (routine x 2)     Status: None (Preliminary result)    Collection Time: 08/17/20  3:41 PM   Specimen: BLOOD LEFT HAND  Result Value Ref Range Status   Specimen Description   Final    BLOOD LEFT HAND Performed at [facility] [address], [place], [place] [address]    Special Requests   Final    BOTTLES DRAWN AEROBIC AND ANAEROBIC Blood Culture adequate volume Performed at [facility] [address] [place], [place] [address]    Culture   Final    NO GROWTH 2 DAYS Performed at [facility] [address], [place], [place] [address]    Report Status PENDING  Incomplete  Anaerobic culture     Status: None (Preliminary result)   Collection Time: 08/18/20 11:28 AM   Specimen: PATH Cytology CSF; Cerebrospinal Fluid  Result Value Ref Range Status   Specimen Description   Final    CSF Performed at [facility] [address], [place], [place] [address]    Special Requests   Final    NONE Performed at [facility], [place] [address], [place], [place] [address]    Gram Stain   Final    WBC PRESENT, PREDOMINANTLY MONONUCLEAR NO ORGANISMS SEEN CYTOSPIN SMEAR Performed at [facility], [place] [address], [place], [place] [address]    Culture PENDING  Incomplete   Report Status PENDING  Incomplete  CSF culture     Status: None (Preliminary result)   Collection Time: 08/18/20 11:28 AM   Specimen: PATH Cytology CSF; Cerebrospinal Fluid  Result Value Ref Range Status   Specimen Description   Final    CSF Performed at [facility] [address], [place], [place] [address]    Special Requests   Final    NONE Performed at [facility], [place] [address], [place], [place] [address]    Gram Stain   Final    NO WBC SEEN NO ORGANISMS SEEN Gram Stain Report Called to,Read Back By and Verified With: ROPER, T. RN @1246  ON 12.1.2021 BY [facility] Performed at [facility], [place] [address], [place],  [place] [address]    Culture   Final    NO GROWTH < 24 HOURS Performed at [facility] [address], [place], [place] [address]    Report Status PENDING  Incomplete  Culture, fungus without smear     Status: None (Preliminary result)   Collection Time: 08/18/20 11:28 AM   Specimen: PATH Cytology CSF; Cerebrospinal Fluid  Result Value Ref Range Status   Specimen Description   Final    CSF Performed at [facility] [address] [place], [place] [address]    Special Requests   Final    NONE Performed at [facility], [place] [address] [place], [place] [address]    Culture   Final    NO GROWTH < 24 HOURS Performed at [facility] [address] [place],  [address]    Report Status PENDING  Incomplete         Radiology Studies: DG CHEST PORT 1 VIEW  Result Date: 08/17/2020 CLINICAL DATA:  4 day history of weakness. EXAM: PORTABLE  CHEST 1 VIEW COMPARISON:  08/15/2020 FINDINGS: Lungs are hyperexpanded. Streaky opacity at the left base likely atelectasis although superimposed component of pneumonia cannot be excluded. The cardiopericardial silhouette is within normal limits for size. Bones are diffusely demineralized. Telemetry leads overlie the chest. IMPRESSION: Hyperexpansion with atelectasis or infiltrate at the left base. Electronically Signed   By: Misty Stanley M.D.   On: 08/17/2020 16:52   DG FLUORO GUIDE LUMBAR PUNCTURE  Result Date: 08/18/2020 CLINICAL DATA:  Dementia. EXAM: DIAGNOSTIC LUMBAR PUNCTURE UNDER FLUOROSCOPIC GUIDANCE FLUOROSCOPY TIME:  Fluoroscopy Time:  0 minutes 45 seconds Radiation Exposure Index (if provided by the fluoroscopic device): 15.5 mGy Number of Acquired Spot Images: 0 PROCEDURE: Informed consent was obtained from the patient prior to the procedure, including potential complications of headache, allergy, and pain. With the patient prone, the lower back was prepped with alcohol due to  iodine/contrast allergy. 1% Lidocaine was used for local anesthesia. Lumbar puncture was performed at the L2-3 level using a 20 gauge needle with return of blood tinged CSF which immediately cleared, with an opening pressure of less than 10 cm water. 7 ml of CSF were obtained for laboratory studies. The patient tolerated the procedure well and there were no apparent complications. IMPRESSION: Successful lumbar puncture under fluoroscopy. Electronically Signed   By: Lorin Picket M.D.   On: 08/18/2020 12:09        Scheduled Meds:  aspirin EC  81 mg Oral QPM   buPROPion  300 mg Oral Daily   donepezil  5 mg Oral QHS   enoxaparin (LOVENOX) injection  40 mg Subcutaneous Daily   escitalopram  10 mg Oral Daily   feeding supplement  1 Container Oral BID BM   feeding supplement  237 mL Oral Q24H   multivitamin  15 mL Oral Daily   pantoprazole  40 mg Oral Daily   QUEtiapine  25 mg Oral QHS   senna  1 tablet Oral BID   Continuous Infusions:  sodium chloride 100 mL/hr at 08/19/20 0616   acyclovir 500 mg (08/19/20 1207)   cefTRIAXone (ROCEPHIN)  IV 2 g (08/19/20 0617)     LOS: 4 days    Time spent: 30 minutes    Barb Merino, MD [facility] Pager [phone]

## 2020-08-19 NOTE — TOC Initial Note (Addendum)
Transition of Care [facility]) - Initial/Assessment Note    Patient Details  Name: April Decker MRN: [id number] Date of Birth: [birth year]  Transition of Care [facility]) CM/SW Contact:    Norval Morton Jones Broom, LCSW Phone Number:  11/30/20214:00pm  Clinical Narrative:                  Patient is an 79 year old female from [place].  Patient lives with her husband.  Patient has some confusion, CSW spoke to her husband to complete assessment.  Patient's husband states that she has not been to rehab before.  CSW explained what to expect and the process on how discharge planning is facilitated.  Patient's husband stated that the reason they moved to [facility] is because of the different levels of care, and he is hopeful she does well.  CSW explained to patient's husband what to expect day of discharge.  Patient's husband did not express any other concerns or issues.  CSW to continue to follow patient's progress throughout discharge planning.  CSW spoke to [place] at [facility], she stated they should be able to accept patient once she is medically ready.  Patient will need to have palliative follow her per palliative recommendations.  Expected Discharge Plan: [place] Barriers to Discharge: Continued Medical Work up   Patient Goals and CMS Choice Patient states their goals for this hospitalization and ongoing recovery are:: To go to [facility] for short term rehab, then return back to [employer] with her husband. CMS Medicare.gov Compare Post Acute Care list provided to:: Patient Represenative (must comment) Choice offered to / list presented to : Spouse  Expected Discharge Plan and Services Expected Discharge Plan: [place] arrangements for the past 2 months: [place]                                      Prior Living Arrangements/Services Living arrangements for the past 2 months:  [place] Lives with:: Spouse Patient language and need for interpreter reviewed:: Yes Do you feel safe going back to the place where you live?: No   Patient's husband feels she needs rehab first before she is able to return back home.  Need for Family Participation in Patient Care: Yes (Comment) Care giver support system in place?: No (comment)   Criminal Activity/Legal Involvement Pertinent to Current Situation/Hospitalization: No - Comment as needed  Activities of Daily Living Home Assistive Devices/Equipment: None ADL Screening (condition at time of admission) Patient's cognitive ability adequate to safely complete daily activities?: No Is the patient deaf or have difficulty hearing?: Yes Does the patient have difficulty seeing, even when wearing glasses/contacts?: No Does the patient have difficulty concentrating, remembering, or making decisions?: Yes Patient able to express need for assistance with ADLs?: Yes Does the patient have difficulty dressing or bathing?: Yes Independently performs ADLs?: No Communication: Independent Dressing (OT): Needs assistance Is this a change from baseline?: Change from baseline, expected to last <3days Grooming: Needs assistance Is this a change from baseline?: Change from baseline, expected to last <3 days Feeding: Independent Bathing: Needs assistance Is this a change from baseline?: Change from baseline, expected to last <3 days Toileting: Needs assistance Is this a change from baseline?: Change from baseline, expected to last <3 days In/Out Bed: Needs assistance Is this a change  from baseline?: Change from baseline, expected to last <3 days Walks in Home: Needs assistance Is this a change from baseline?: Change from baseline, expected to last <3 days Does the patient have difficulty walking or climbing stairs?: Yes Weakness of Legs: Both Weakness of Arms/Hands: Both  Permission Sought/Granted Permission sought to  share information with : Facility Sport and exercise psychologist Permission granted to share information with : Yes, Verbal Permission Granted  Share Information with NAME: [facility] [phone]  [phone]  Permission granted to share info w AGENCY: SNF admissions        Emotional Assessment Appearance:: Appears stated age   Affect (typically observed): Accepting, Appropriate, Calm Orientation: : Oriented to Self, Oriented to Place Alcohol / Substance Use: Not Applicable Psych Involvement: No (comment)  Admission diagnosis:  Weakness [R53.1] Acute encephalopathy [G93.40] AMS (altered mental status) [R41.82] Patient Active Problem List   Diagnosis Date Noted  . Protein-calorie malnutrition, severe 08/17/2020  . AMS (altered mental status) 08/15/2020  . AKI (acute kidney injury) ([place]) 08/15/2020  . Sepsis ([place]) 08/15/2020  . Dehydration 08/15/2020  . Severe episode of recurrent major depressive disorder, without psychotic features ([place]) 12/30/2019  . Alzheimer's dementia without behavioral disturbance ([place]) 10/07/2019  . Major neurocognitive disorder ([place]) 07/29/2019  . Memory loss 07/29/2019  . Medication side effect, initial encounter 03/04/2019  . Chronic anxiety 09/05/2018  . Depression 09/05/2018  . Dysphagia 09/05/2018  . Fatigue 09/05/2018  . GERD (gastroesophageal reflux disease) 09/05/2018  . Hearing impairment 09/05/2018  . IBS (irritable bowel syndrome) 09/05/2018  . Osteoarthritis 09/05/2018  . Osteopenia 09/05/2018  . Trigeminal neuralgia 09/05/2018  . Status post cataract extraction of both eyes with insertion of intraocular lens 04/26/2018  . Hyperopia with astigmatism and presbyopia, bilateral 04/27/2017  . Myopia with astigmatism and presbyopia, bilateral 04/27/2017  . Hypertension 02/18/2013  . Atrial fibrillation ([place]) 06/21/2010  . EDEMA 06/21/2010  . Benign essential hypertension 12/17/2009  . Syncope 12/17/2009  . PALPITATIONS,  OCCASIONAL 12/17/2009   PCP:  Haywood Pao, MD Pharmacy:   [facility] [place], [place] - [address] [place] [address] [place] [address] Phone: [phone] Fax: [phone]  [facility] [place], [place] - [address] [address] Phone: [phone] Fax: [phone]     Social Determinants of Health (SDOH) Interventions    Readmission Risk Interventions No flowsheet data found.

## 2020-08-19 NOTE — Plan of Care (Signed)
  Problem: Activity: Goal: Risk for activity intolerance will decrease Outcome: Progressing   Problem: Nutrition: Goal: Adequate nutrition will be maintained Outcome: Progressing   Problem: Coping: Goal: Level of anxiety will decrease Outcome: Progressing   Problem: Elimination: Goal: Will not experience complications related to bowel motility Outcome: Progressing   

## 2020-08-20 DIAGNOSIS — E43 Unspecified severe protein-calorie malnutrition: Secondary | ICD-10-CM

## 2020-08-20 LAB — HSV 1/2 PCR, CSF
HSV-1 DNA: NEGATIVE
HSV-2 DNA: NEGATIVE

## 2020-08-20 LAB — CULTURE, BLOOD (ROUTINE X 2)
Culture: NO GROWTH
Special Requests: ADEQUATE

## 2020-08-20 MED ORDER — QUETIAPINE FUMARATE 25 MG PO TABS: 25.0000 mg | ORAL_TABLET | Freq: Every day | ORAL | 0 refills | Status: AC

## 2020-08-20 MED ORDER — HALOPERIDOL 5 MG PO TABS
5.0000 mg | ORAL_TABLET | Freq: Four times a day (QID) | ORAL | 0 refills | Status: DC | PRN
Start: 2020-08-20 — End: 2020-08-20

## 2020-08-20 MED ORDER — QUETIAPINE FUMARATE 25 MG PO TABS: 25.0000 mg | ORAL_TABLET | Freq: Every day | ORAL | 0 refills | Status: DC

## 2020-08-20 MED ORDER — HALOPERIDOL 5 MG PO TABS
5.0000 mg | ORAL_TABLET | Freq: Four times a day (QID) | ORAL | Status: DC | PRN
  Filled 2020-08-20: qty 1

## 2020-08-20 MED ORDER — HALOPERIDOL LACTATE 5 MG/ML IJ SOLN
2.0000 mg | Freq: Four times a day (QID) | INTRAMUSCULAR | Status: DC | PRN
  Administered 2020-08-20 (×2): 2 mg via INTRAMUSCULAR
  Filled 2020-08-20 (×2): qty 1

## 2020-08-20 MED ORDER — LABETALOL HCL 5 MG/ML IV SOLN
10.0000 mg | Freq: Once | INTRAVENOUS | Status: AC
  Administered 2020-08-20: 10 mg via INTRAVENOUS
  Filled 2020-08-20: qty 4

## 2020-08-20 MED ORDER — HALOPERIDOL 5 MG PO TABS: 5.0000 mg | ORAL_TABLET | Freq: Four times a day (QID) | ORAL | 0 refills | Status: AC | PRN

## 2020-08-20 NOTE — Progress Notes (Signed)
Transport team came to get pt to take to facility.  IV and purewick removed.  Transport team has pt discharge packet.

## 2020-08-20 NOTE — Progress Notes (Signed)
   08/20/20 0600  Vitals  BP (!) 182/78  BP Location Left Arm  BP Method Manual  Patient Position (if appropriate) Lying  ECG Heart Rate 80  Resp 18  MEWS COLOR  MEWS Score Color Green  MEWS Score  MEWS Temp 0  MEWS Systolic 0  MEWS Pulse 0  MEWS RR 0  MEWS LOC 0  MEWS Score 0   MD notified. Awaiting new orders.

## 2020-08-20 NOTE — Progress Notes (Signed)
Palliative care brief note  I met with patient and her husband.  She has improved significantly over the past 24 hours.  Plan is to transition to [facility] for continued rehab.  Reviewed MOST form and recommended that he consider completion.  He would like to review form and is planning to discuss with her outpatient physicians.  Full note to follow.  Micheline Rough, MD [place] Team [phone]

## 2020-08-20 NOTE — TOC Progression Note (Addendum)
Transition of Care [facility]) - Progression Note    Patient Details  Name: LYANNE KATES MRN: [id number] Date of Birth: 01-04-1941  Transition of Care [facility]) CM/SW Contact  Dartanyan Deasis, Juliann Pulse, RN Phone Number: 08/20/2020, 12:15 PM  Clinical Narrative: Left vm messages w/[facility] rep Luellen Pucker, & vm on main tel# when transferred-can patient return today SNF w/palliative care services, & IM haldol-await response.  1p-[facility] rep Audrey-can return back today-recc IM haldol with end date no greater than 14day. Can receive Palliative Care services if recc. They recc d/c summary asap for timely return back by PTAR.rm#[email or web address], report tel#[phone].     Expected Discharge Plan: [place] Barriers to Discharge: Continued Medical Work up  Expected Discharge Plan and Services Expected Discharge Plan: [place] arrangements for the past 2 months: [place]                                       Social Determinants of Health (SDOH) Interventions    Readmission Risk Interventions No flowsheet data found.

## 2020-08-20 NOTE — TOC Transition Note (Signed)
Transition of Care [facility]) - CM/SW Discharge Note   Patient Details  Name: TASHUNDA VANDEZANDE MRN: [id number] Date of Birth: July 03, 1941  Transition of Care [facility]) CM/SW Contact:  Dessa Phi, RN Phone Number: 08/20/2020, 1:39 PM   Clinical Narrative:  Patient d/c today to [facility] SNF w/palliative care services-rep Luellen Pucker able to accept. No new covid needed, manual scripts-need MD signature. SNR. Going to rm #[id number], [facility], tel# for nsg report [phone]. PTAR called.No further CM needs.    Final next level of care: [place] Barriers to Discharge: No Barriers Identified   Patient Goals and CMS Choice Patient states their goals for this hospitalization and ongoing recovery are:: To go to [facility] for short term rehab, then return back to [employer] with her husband. CMS Medicare.gov Compare Post Acute Care list provided to:: Patient Represenative (must comment) Choice offered to / list presented to : Spouse  Discharge Placement              Patient [facility] bed at: [facility] Patient to be transferred to facility by: [place] Name of family member notified: Marcello Moores spouse [phone] Patient and family notified of of transfer: 08/20/20  Discharge Plan and Services                                     Social Determinants of Health (SDOH) Interventions     Readmission Risk Interventions No flowsheet data found.

## 2020-08-20 NOTE — Discharge Summary (Signed)
Physician Discharge Summary  April Decker QMG:[id number] DOB: 10/22/1940 DOA: 08/15/2020  PCP: Haywood Pao, MD  Admit date: 08/15/2020 Discharge date: 08/20/2020  Admitted From: Independent living facility Disposition: Skilled nursing facility  Recommendations for Outpatient Follow-up:  1. Follow up with PCP at the facility. 2. Consult palliative care on arrival to a skilled nursing facility.  If further deterioration of symptoms, plan is to start comfort care and hospice evaluation.  Home Health: Not applicable Equipment/Devices: Not applicable  Discharge Condition: Fair CODE STATUS: DNR/DNI Diet recommendation: Regular diet, as tolerated  Discharge summary:  79 year old female with history of chronic A. fib, hypertension and advanced dementia brought to ER with about 4 days of weakness, lethargy and being poorly responsive. Reportedly at baseline she could ambulate and keep up simple conversations.  Recently worsening symptoms.  In the emergency room, hemodynamically stable.  Noted to have acute kidney injury.  WBC count 17.  Evidence of UTI.  Lactic acid 2.5.  Mildly elevated troponin.  Treated with IV fluids and Rocephin and admitted to the hospital.  Patient initially remained afebrile and was treated with Rocephin for presumed UTI.  Blood cultures and urine cultures were negative.  She spiked temperature and became more lethargic while in the hospital, a lumbar puncture was done and treated as meningitis.  Final results negative for bacterial meningitis, negative for HSV PCR.  Assessment & plan of care:   Acute metabolic encephalopathy in the setting of underlying advanced dementia: Mostly multifactorial secondary to infection, dehydration.  Encourage oral intake.  Fever episode/altered mentation: Initially thought to be from UTI.   Blood cultures 11/28 no growth, blood cultures 11/30 no growth Urine culture 11/28 normal. Chest x-ray 07/2017 normal.  Chest x-ray 11/30  with possible left lower lobe atelectasis. WBC count normal. Procalcitonin 14. Difficult localizing symptoms due to dementia. Lumbar puncture with no evidence of bacterial meningitis.  Broad-spectrum antibiotic discontinued.   Treated with acyclovir.  PCR negative.  Completed 5 days of Rocephin.     Acute kidney injury: Treated with IV fluids with improvement.  Normalized.  Hypokalemia/hypophosphatemia: Replaced.  Normalized.  Chronic A. fib: Rate controlled.  In sinus rhythm today.  Rate controlled.  Hypophosphatemia: Replaced aggressively.  Hypertension: Blood pressures are elevated.  She does not take any blood pressure medicine at home.  This is probably related to her acute illness, agitations and restlessness.  This patient with limited life expectancy, will not start her on any blood pressure medications.  Advance care planning /dementia with behavioral disturbances:  All-time fall precautions.  On Aricept and Wellbutrin.  Patient has very rapidly progressive dementia and progressed for the last 2 years. Did not find any acute illnesses.  This is probably her new baseline. Some improvement with Seroquel 25 mg at night and Haldol as needed. CODE STATUS and MOST level of care discussed, educated, instructions provided to patient's husband. Patient is DNR/DNI.  Due to her advanced dementia and behavioral problems, she has poor quality of life and limited life expectancy. Seen by palliative care in the hospital. Consult palliative medicine when she comes to SNF.  If she has to deteriorate further, the plan is to involve hospice and provide hospice level of care.    Discharge Diagnoses:  Active Problems:   Atrial fibrillation (HCC)   Hypertension   Alzheimer's dementia without behavioral disturbance (HCC)   GERD (gastroesophageal reflux disease)   AMS (altered mental status)   AKI (acute kidney injury) ([place])   Sepsis ([place])  Dehydration   Protein-calorie malnutrition,  severe    Discharge Instructions  Discharge Instructions    Diet general   Complete by: As directed    Discharge instructions   Complete by: As directed    Please consult palliative care at skilled nursing facility.   Increase activity slowly   Complete by: As directed      Allergies as of 08/20/2020      Reactions   Diltiazem Swelling   edema   Iodides Anaphylaxis   Other Anaphylaxis, Other (See Comments)   X-Ray Dye--brain stem  Other reaction(s): ANAPHYLAXIS   Shellfish Allergy Hives   Iohexol Hives          Medication List    STOP taking these medications   OVER THE COUNTER MEDICATION     TAKE these medications   aspirin EC 81 MG tablet Take 81 mg by mouth at bedtime.   BIOTIN PO Take 500 mcg by mouth at bedtime.   buPROPion 300 MG 24 hr tablet Commonly known as: Wellbutrin XL Take 1 tablet (300 mg total) by mouth daily. What changed: when to take this   CALCIUM-MAGNESIUM-VITAMIN D PO Take 1 tablet by mouth at bedtime. Calcium 500 mg, magnesium 250 mg, D3 1000 units   cyanocobalamin 1000 MCG/ML injection Commonly known as: (VITAMIN B-12) Inject 1,000 mcg into the muscle every 28 (twenty-eight) days.   donepezil 5 MG tablet Commonly known as: ARICEPT Take 5 mg by mouth at bedtime. What changed: Another medication with the same name was removed. Continue taking this medication, and follow the directions you see here.   escitalopram 10 MG tablet Commonly known as: Lexapro Take 1 tablet (10 mg total) by mouth daily. What changed: when to take this   EVENING PRIMROSE OIL PO Take 1,300 mg by mouth at bedtime.   GLUCOSAMINE-CHONDROITIN PO Take 1 tablet by mouth at bedtime. Glucosamine 1500 mg/ chondroitin 1200 mg   haloperidol 5 MG tablet Commonly known as: HALDOL Take 1 tablet (5 mg total) by mouth every 6 (six) hours as needed for agitation.   Omega 3 1000 MG Caps Take 1,000 mg by mouth at bedtime.   omeprazole 20 MG capsule Commonly known  as: PRILOSEC Take 20 mg by mouth at bedtime.   QUEtiapine 25 MG tablet Commonly known as: SEROQUEL Take 1 tablet (25 mg total) by mouth at bedtime.   Senna-S 8.6-50 MG tablet Generic drug: senna-docusate Take 1 tablet by mouth daily after lunch.   vitamin C 500 MG tablet Commonly known as: ASCORBIC ACID Take 500 mg by mouth at bedtime.   Vitamin D 125 MCG (5000 UT) Caps Take 5,000 Units by mouth at bedtime.       Allergies  Allergen Reactions  . Diltiazem Swelling    edema  . Iodides Anaphylaxis  . Other Anaphylaxis and Other (See Comments)    X-Ray Dye--brain stem  Other reaction(s): ANAPHYLAXIS    . Shellfish Allergy Hives  . Iohexol Hives          Consultations:  Palliative medicine   Procedures/Studies: CT ABDOMEN PELVIS WO CONTRAST  Result Date: 08/15/2020 CLINICAL DATA:  Abdominal pain.  Diverticulitis suspected. EXAM: CT ABDOMEN AND PELVIS WITHOUT CONTRAST TECHNIQUE: Multidetector CT imaging of the abdomen and pelvis was performed following the standard protocol without IV contrast. COMPARISON:  Jan 20, 2019 FINDINGS: Lower chest: Minimal bilateral left greater than right pleural thickening versus effusion. Hepatobiliary: No focal liver abnormality is seen. No gallstones, gallbladder wall thickening, or biliary  dilatation. Pancreas: Unremarkable. No pancreatic ductal dilatation or surrounding inflammatory changes. Spleen: Normal in size without focal abnormality. Adrenals/Urinary Tract: Adrenal glands are unremarkable. Kidneys are normal, without renal calculi, focal lesion, or hydronephrosis. Bladder is unremarkable. Stomach/Bowel: Stomach is within normal limits. No evidence of appendicitis. No evidence of bowel wall thickening, distention, or inflammatory changes. No evidence of diverticulosis. Moderate amount of formed stool throughout the colon. Vascular/Lymphatic: Aortic atherosclerosis. No enlarged abdominal or pelvic lymph nodes. Reproductive: Atrophic or  surgically absent uterus. No adnexal masses. Other: No abdominal wall hernia or abnormality. No abdominopelvic ascites. Musculoskeletal: No acute osseous findings. Mild spondylosis of the lumbosacral spine. IMPRESSION: 1. No evidence of acute abnormalities within the abdomen or pelvis. 2. Moderate amount of formed stool throughout the colon, consistent with constipation. 3. Minimal bilateral left greater than right pleural thickening versus effusion. 4. Aortic atherosclerosis. Aortic Atherosclerosis (ICD10-I70.0). Electronically Signed   By: Fidela Salisbury M.D.   On: 08/15/2020 16:44   DG Chest 2 View  Result Date: 08/15/2020 CLINICAL DATA:  Confusion weakness over the past 3 days. EXAM: CHEST - 2 VIEW COMPARISON:  Chest radiograph dated 01/31/2014. FINDINGS: The heart size and mediastinal contours are within normal limits. The lungs are hyperinflated, consistent with emphysema. Both lungs are clear. Degenerative changes are seen in the spine. IMPRESSION: Emphysema without acute pulmonary process. Electronically Signed   By: Zerita Boers M.D.   On: 08/15/2020 14:40   CT Head Wo Contrast  Result Date: 08/15/2020 CLINICAL DATA:  Altered mental status over the past 3 days. EXAM: CT HEAD WITHOUT CONTRAST TECHNIQUE: Contiguous axial images were obtained from the base of the skull through the vertex without intravenous contrast. COMPARISON:  CT head dated 01/31/2014. FINDINGS: Brain: No evidence of acute infarction, hemorrhage, hydrocephalus, extra-axial collection or mass lesion/mass effect. There is mild cerebral volume loss with associated ex vacuo dilatation. Periventricular white matter hypoattenuation likely represents chronic small vessel ischemic disease. Vascular: No hyperdense vessel or unexpected calcification. Skull: Normal. Negative for fracture or focal lesion. Sinuses/Orbits: No acute finding. Other: None. IMPRESSION: No acute intracranial process. Electronically Signed   By: Zerita Boers  M.D.   On: 08/15/2020 14:34   DG CHEST PORT 1 VIEW  Result Date: 08/17/2020 CLINICAL DATA:  4 day history of weakness. EXAM: PORTABLE CHEST 1 VIEW COMPARISON:  08/15/2020 FINDINGS: Lungs are hyperexpanded. Streaky opacity at the left base likely atelectasis although superimposed component of pneumonia cannot be excluded. The cardiopericardial silhouette is within normal limits for size. Bones are diffusely demineralized. Telemetry leads overlie the chest. IMPRESSION: Hyperexpansion with atelectasis or infiltrate at the left base. Electronically Signed   By: Misty Stanley M.D.   On: 08/17/2020 16:52   DG FLUORO GUIDE LUMBAR PUNCTURE  Result Date: 08/18/2020 CLINICAL DATA:  Dementia. EXAM: DIAGNOSTIC LUMBAR PUNCTURE UNDER FLUOROSCOPIC GUIDANCE FLUOROSCOPY TIME:  Fluoroscopy Time:  0 minutes 45 seconds Radiation Exposure Index (if provided by the fluoroscopic device): 15.5 mGy Number of Acquired Spot Images: 0 PROCEDURE: Informed consent was obtained from the patient prior to the procedure, including potential complications of headache, allergy, and pain. With the patient prone, the lower back was prepped with alcohol due to iodine/contrast allergy. 1% Lidocaine was used for local anesthesia. Lumbar puncture was performed at the L2-3 level using a 20 gauge needle with return of blood tinged CSF which immediately cleared, with an opening pressure of less than 10 cm water. 7 ml of CSF were obtained for laboratory studies. The patient tolerated the procedure well  and there were no apparent complications. IMPRESSION: Successful lumbar puncture under fluoroscopy. Electronically Signed   By: Lorin Picket M.D.   On: 08/18/2020 12:09    (Echo, Carotid, EGD, Colonoscopy, ERCP)    Subjective: Patient seen and examined.  Today she was more agitated in the morning and became very fidgety.  Given Haldol with improvement.  And examined with husband at the bedside.  She remains quiet calm.  She is pleasant and  confused.  Afebrile.   Discharge Exam: Vitals:   08/20/20 0649 08/20/20 0749  BP: (!) 162/70 (!) 178/88  Pulse:    Resp: 15   Temp:    SpO2:     Vitals:   08/20/20 0529 08/20/20 0600 08/20/20 0649 08/20/20 0749  BP: (!) 179/95 (!) 182/78 (!) 162/70 (!) 178/88  Pulse: 82     Resp: 12 18 15    Temp: 98 F (36.7 C)     TempSrc:      SpO2: 100%     Weight:      Height:        General: Pt is alert, awake, not in acute distress Chronically sick looking.  Anxious.  Occasionally impulsive and agitated. Cardiovascular: RRR, S1/S2 +, no rubs, no gallops Respiratory: CTA bilaterally, no wheezing, no rhonchi Abdominal: Soft, NT, ND, bowel sounds + Extremities: no edema, no cyanosis    The results of significant diagnostics from this hospitalization (including imaging, microbiology, ancillary and laboratory) are listed below for reference.     Microbiology: Recent Results (from the past 240 hour(s))  Urine culture     Status: None   Collection Time: 08/15/20  1:17 PM   Specimen: Urine, Random  Result Value Ref Range Status   Specimen Description   Final    URINE, RANDOM Performed at [facility], [address] [place], [place] [address]    Special Requests   Final    NONE Performed at [facility], [address] [place], [place] [address]    Culture   Final    NO GROWTH Performed at [facility], [place] [address], [place], [place] [address]    Report Status 08/16/2020 FINAL  Final  Resp Panel by RT-PCR (Flu A&B, Covid) Nasopharyngeal Swab     Status: None   Collection Time: 08/15/20  1:18 PM   Specimen: Nasopharyngeal Swab; Nasopharyngeal(NP) swabs in vial transport medium  Result Value Ref Range Status   SARS Coronavirus 2 by RT PCR NEGATIVE NEGATIVE Final    Comment: (NOTE) SARS-CoV-2 target nucleic acids are NOT DETECTED.  The SARS-CoV-2 RNA is generally detectable in upper respiratory specimens during the acute phase of infection.  The lowest concentration of SARS-CoV-2 viral copies this assay can detect is 138 copies/mL. A negative result does not preclude SARS-Cov-2 infection and should not be used as the sole basis for treatment or other patient management decisions. A negative result may occur with  improper specimen collection/handling, submission of specimen other than nasopharyngeal swab, presence of viral mutation(s) within the areas targeted by this assay, and inadequate number of viral copies(<138 copies/mL). A negative result must be combined with clinical observations, patient history, and epidemiological information. The expected result is Negative.  Fact Sheet for Patients:  EntrepreneurPulse.com.au  Fact Sheet for Healthcare Providers:  IncredibleEmployment.be  This test is no t yet approved or cleared by the [place] FDA and  has been authorized for detection and/or diagnosis of SARS-CoV-2 by FDA under an Emergency  Use Authorization (EUA). This EUA will remain  in effect (meaning this test can be used) for the duration of the COVID-19 declaration under Section 564(b)(1) of the Act, 21 U.S.C.section 360bbb-3(b)(1), unless the authorization is terminated  or revoked sooner.       Influenza A by PCR NEGATIVE NEGATIVE Final   Influenza B by PCR NEGATIVE NEGATIVE Final    Comment: (NOTE) The Xpert Xpress SARS-CoV-2/FLU/RSV plus assay is intended as an aid in the diagnosis of influenza from Nasopharyngeal swab specimens and should not be used as a sole basis for treatment. Nasal washings and aspirates are unacceptable for Xpert Xpress SARS-CoV-2/FLU/RSV testing.  Fact Sheet for Patients: EntrepreneurPulse.com.au  Fact Sheet for Healthcare Providers: IncredibleEmployment.be  This test is not yet approved or cleared by the [place] FDA and has been authorized for detection and/or diagnosis of SARS-CoV-2 by FDA  under an Emergency Use Authorization (EUA). This EUA will remain in effect (meaning this test can be used) for the duration of the COVID-19 declaration under Section 564(b)(1) of the Act, 21 U.S.C. section 360bbb-3(b)(1), unless the authorization is terminated or revoked.  Performed at [facility], [address] [place], [place] [address]   Blood Culture (routine x 2)     Status: None   Collection Time: 08/15/20  2:04 PM   Specimen: Right Antecubital; Blood  Result Value Ref Range Status   Specimen Description   Final    RIGHT ANTECUBITAL BLOOD Performed at [facility], [place] [address], [place], [place] [address]    Special Requests   Final    BOTTLES DRAWN AEROBIC AND ANAEROBIC Blood Culture adequate volume Performed at [facility], [address] [place], [place] [address]    Culture   Final    NO GROWTH 5 DAYS Performed at [facility], [place] [address] [place], [place] [address]    Report Status 08/20/2020 FINAL  Final  Culture, blood (routine x 2)     Status: None (Preliminary result)   Collection Time: 08/17/20  3:41 PM   Specimen: BLOOD RIGHT HAND  Result Value Ref Range Status   Specimen Description   Final    BLOOD RIGHT HAND Performed at [place] [address] [place], [place] [address]    Special Requests   Final    BOTTLES DRAWN AEROBIC AND ANAEROBIC Blood Culture adequate volume Performed at [place] [address] [place], [place] [address]    Culture   Final    NO GROWTH 3 DAYS Performed at [facility], [place] [address] [place], [place] [address]    Report Status PENDING  Incomplete  Culture, blood (routine x 2)     Status: None (Preliminary result)   Collection Time: 08/17/20  3:41 PM   Specimen: BLOOD LEFT HAND  Result Value Ref Range Status   Specimen Description   Final    BLOOD LEFT HAND Performed at [place] [address],  [place], [place] [address]    Special Requests   Final    BOTTLES DRAWN AEROBIC AND ANAEROBIC Blood Culture adequate volume Performed at [place] [address], [place], [place] [address]    Culture   Final    NO GROWTH 3 DAYS Performed at [facility], [place] [address], [place], [place] [address]    Report Status PENDING  Incomplete  Anaerobic culture  Status: None (Preliminary result)   Collection Time: 08/18/20 11:28 AM   Specimen: PATH Cytology CSF; Cerebrospinal Fluid  Result Value Ref Range Status   Specimen Description   Final    CSF Performed at [place] [address], [place], [place] [address]    Special Requests   Final    NONE Performed at [facility], [place] [address] [place], [place] [address]    Gram Stain   Final    WBC PRESENT, PREDOMINANTLY MONONUCLEAR NO ORGANISMS SEEN CYTOSPIN SMEAR Performed at [facility], [place] [address], [place], [place] [address]    Culture   Final    NO ANAEROBES ISOLATED; CULTURE IN PROGRESS FOR 5 DAYS   Report Status PENDING  Incomplete  CSF culture     Status: None (Preliminary result)   Collection Time: 08/18/20 11:28 AM   Specimen: PATH Cytology CSF; Cerebrospinal Fluid  Result Value Ref Range Status   Specimen Description   Final    CSF Performed at [facility], [place] [address], [place], [place] [address]    Special Requests   Final    NONE Performed at [facility], [place] [address], [place], [place] [address]    Gram Stain   Final    NO WBC SEEN NO ORGANISMS SEEN Gram Stain Report Called to,Read Back By and Verified With: ROPER, T. RN @1246  ON 12.1.2021 BY [facility] Performed at [facility], [place] [address], [place], [place] [address]    Culture   Final    NO GROWTH 2 DAYS Performed at [place] [address], [place], [place] [address]    Report Status PENDING  Incomplete   Culture, fungus without smear     Status: None (Preliminary result)   Collection Time: 08/18/20 11:28 AM   Specimen: PATH Cytology CSF; Cerebrospinal Fluid  Result Value Ref Range Status   Specimen Description   Final    CSF Performed at [place] [address] [place], [place] [address]    Special Requests   Final    NONE Performed at [facility], [place] [address], [place],  [address]    Culture   Final    NO FUNGUS ISOLATED AFTER 2 DAYS Performed at [facility], [place] [address] [place],  [address]    Report Status PENDING  Incomplete     Labs: BNP (last 3 results) Recent Labs    08/15/20 1946  BNP 027.7*   Basic Metabolic Panel: Recent Labs  Lab 08/15/20 1230 08/15/20 1946 08/16/20 0918 08/18/20 0933  NA 133* 138 136 134*  K 3.3* 3.6 3.4* 3.8  CL 101 105 102 100  CO2 24 24 23 24   GLUCOSE 160* 91 137* 95  BUN 26* 20 20 13   CREATININE 1.06* 0.90 0.82 0.75  CALCIUM 9.2 8.9 9.2 8.6*  MG  --  2.0 2.1 1.8  PHOS  --  3.0 1.5*  --    Liver Function Tests: Recent Labs  Lab 08/15/20 1230 08/16/20 0918  AST 28 41  ALT 17 31  ALKPHOS 42 49  BILITOT 1.2 0.6  PROT 6.2* 6.7  ALBUMIN 3.5 3.8   No results for input(s): LIPASE, AMYLASE in the last 168 hours. Recent Labs  Lab 08/15/20 1543  AMMONIA <9*   CBC: Recent Labs  Lab 08/15/20 1230 08/16/20 0918 08/18/20 0933  WBC 17.3* 10.7* 8.6  NEUTROABS 15.7*  9.3* 6.8  HGB 12.1 12.4 12.6  HCT 35.3* 37.5 37.9  MCV 94.4 97.2 94.3  PLT 187 174 214   Cardiac Enzymes: Recent Labs  Lab 08/15/20 1946  CKTOTAL 158   BNP: Invalid input(s): POCBNP CBG: Recent Labs  Lab 08/16/20 0733 08/16/20 0832 08/17/20 0759 08/17/20 1219 08/17/20 1639  GLUCAP 75 152* 72 86 97   D-Dimer No results for input(s): DDIMER in the last 72 hours. Hgb A1c No results for input(s): HGBA1C in the last 72 hours. Lipid Profile No results for input(s): CHOL, HDL,  LDLCALC, TRIG, CHOLHDL, LDLDIRECT in the last 72 hours. Thyroid function studies No results for input(s): TSH, T4TOTAL, T3FREE, THYROIDAB in the last 72 hours.  Invalid input(s): FREET3 Anemia work up No results for input(s): VITAMINB12, FOLATE, FERRITIN, TIBC, IRON, RETICCTPCT in the last 72 hours. Urinalysis    Component Value Date/Time   COLORURINE YELLOW 08/15/2020 [place] 08/15/2020 1317   LABSPEC 1.020 08/15/2020 1317   PHURINE 8.0 08/15/2020 1317   GLUCOSEU NEGATIVE 08/15/2020 1317   HGBUR TRACE (A) 08/15/2020 1317   BILIRUBINUR NEGATIVE 08/15/2020 1317   BILIRUBINUR - 10/15/2013 1548   KETONESUR 15 (A) 08/15/2020 1317   PROTEINUR NEGATIVE 08/15/2020 1317   UROBILINOGEN 0.2 01/31/2014 2120   NITRITE NEGATIVE 08/15/2020 1317   LEUKOCYTESUR NEGATIVE 08/15/2020 1317   Sepsis Labs Invalid input(s): PROCALCITONIN,  WBC,  LACTICIDVEN Microbiology Recent Results (from the past 240 hour(s))  Urine culture     Status: None   Collection Time: 08/15/20  1:17 PM   Specimen: Urine, Random  Result Value Ref Range Status   Specimen Description   Final    URINE, RANDOM Performed at [facility], [address] [place], [place] [address]    Special Requests   Final    NONE Performed at [facility], [address] [place], [place] [address]    Culture   Final    NO GROWTH Performed at [facility], [place] [address] [place], [place] [address]    Report Status 08/16/2020 FINAL  Final  Resp Panel by RT-PCR (Flu A&B, Covid) Nasopharyngeal Swab     Status: None   Collection Time: 08/15/20  1:18 PM   Specimen: Nasopharyngeal Swab; Nasopharyngeal(NP) swabs in vial transport medium  Result Value Ref Range Status   SARS Coronavirus 2 by RT PCR NEGATIVE NEGATIVE Final    Comment: (NOTE) SARS-CoV-2 target nucleic acids are NOT DETECTED.  The SARS-CoV-2 RNA is generally detectable in upper respiratory specimens during the acute phase of  infection. The lowest concentration of SARS-CoV-2 viral copies this assay can detect is 138 copies/mL. A negative result does not preclude SARS-Cov-2 infection and should not be used as the sole basis for treatment or other patient management decisions. A negative result may occur with  improper specimen collection/handling, submission of specimen other than nasopharyngeal swab, presence of viral mutation(s) within the areas targeted by this assay, and inadequate number of viral copies(<138 copies/mL). A negative result must be combined with clinical observations, patient history, and epidemiological information. The expected result is Negative.  Fact Sheet for Patients:  EntrepreneurPulse.com.au  Fact Sheet for Healthcare Providers:  IncredibleEmployment.be  This test is no t yet approved or cleared by the [place] FDA and  has been authorized for detection and/or diagnosis of SARS-CoV-2 by FDA under an Emergency Use Authorization (EUA). This EUA will remain  in effect (meaning this test can be used)  for the duration of the COVID-19 declaration under Section 564(b)(1) of the Act, 21 U.S.C.section 360bbb-3(b)(1), unless the authorization is terminated  or revoked sooner.       Influenza A by PCR NEGATIVE NEGATIVE Final   Influenza B by PCR NEGATIVE NEGATIVE Final    Comment: (NOTE) The Xpert Xpress SARS-CoV-2/FLU/RSV plus assay is intended as an aid in the diagnosis of influenza from Nasopharyngeal swab specimens and should not be used as a sole basis for treatment. Nasal washings and aspirates are unacceptable for Xpert Xpress SARS-CoV-2/FLU/RSV testing.  Fact Sheet for Patients: EntrepreneurPulse.com.au  Fact Sheet for Healthcare Providers: IncredibleEmployment.be  This test is not yet approved or cleared by the [place] FDA and has been authorized for detection and/or diagnosis of SARS-CoV-2  by FDA under an Emergency Use Authorization (EUA). This EUA will remain in effect (meaning this test can be used) for the duration of the COVID-19 declaration under Section 564(b)(1) of the Act, 21 U.S.C. section 360bbb-3(b)(1), unless the authorization is terminated or revoked.  Performed at [facility], [address] [place], [place] [address]   Blood Culture (routine x 2)     Status: None   Collection Time: 08/15/20  2:04 PM   Specimen: Right Antecubital; Blood  Result Value Ref Range Status   Specimen Description   Final    RIGHT ANTECUBITAL BLOOD Performed at [facility], [place] [address] [place], [place] [address]    Special Requests   Final    BOTTLES DRAWN AEROBIC AND ANAEROBIC Blood Culture adequate volume Performed at [facility], [address] [place], [place] [address]    Culture   Final    NO GROWTH 5 DAYS Performed at [facility], [place] [address], [place], [place] [address]    Report Status 08/20/2020 FINAL  Final  Culture, blood (routine x 2)     Status: None (Preliminary result)   Collection Time: 08/17/20  3:41 PM   Specimen: BLOOD RIGHT HAND  Result Value Ref Range Status   Specimen Description   Final    BLOOD RIGHT HAND Performed at [place] [address], [place], [place] [address]    Special Requests   Final    BOTTLES DRAWN AEROBIC AND ANAEROBIC Blood Culture adequate volume Performed at [place] [address], [place], [place] [address]    Culture   Final    NO GROWTH 3 DAYS Performed at [facility], [place] [address] [place], [place] [address]    Report Status PENDING  Incomplete  Culture, blood (routine x 2)     Status: None (Preliminary result)   Collection Time: 08/17/20  3:41 PM   Specimen: BLOOD LEFT HAND  Result Value Ref Range Status   Specimen Description   Final    BLOOD LEFT HAND Performed at [place]  [address] [place], [place] [address]    Special Requests   Final    BOTTLES DRAWN AEROBIC AND ANAEROBIC Blood Culture adequate volume Performed at [place] [address] [place], [place] [address]    Culture   Final    NO GROWTH 3 DAYS Performed at [facility], [place] [address], [place], [place] [address]    Report Status PENDING  Incomplete  Anaerobic culture     Status: None (Preliminary result)   Collection Time: 08/18/20 11:28 AM   Specimen: PATH Cytology  CSF; Cerebrospinal Fluid  Result Value Ref Range Status   Specimen Description   Final    CSF Performed at [place] [address], [place], [place] [address]    Special Requests   Final    NONE Performed at [facility], [place] [address], [place], [place] [address]    Gram Stain   Final    WBC PRESENT, PREDOMINANTLY MONONUCLEAR NO ORGANISMS SEEN CYTOSPIN SMEAR Performed at [facility], [place] [address] [place], [place] [address]    Culture   Final    NO ANAEROBES ISOLATED; CULTURE IN PROGRESS FOR 5 DAYS   Report Status PENDING  Incomplete  CSF culture     Status: None (Preliminary result)   Collection Time: 08/18/20 11:28 AM   Specimen: PATH Cytology CSF; Cerebrospinal Fluid  Result Value Ref Range Status   Specimen Description   Final    CSF Performed at [facility], [place] [address] [place], [place] [address]    Special Requests   Final    NONE Performed at [facility], [place] [address] [place], [place] [address]    Gram Stain   Final    NO WBC SEEN NO ORGANISMS SEEN Gram Stain Report Called to,Read Back By and Verified With: ROPER, T. RN @1246  ON 12.1.2021 BY [facility] Performed at [facility], [place] [address] [place], [place] [address]    Culture   Final    NO GROWTH 2 DAYS Performed at [place] [address], [place], [place] [address]    Report Status PENDING   Incomplete  Culture, fungus without smear     Status: None (Preliminary result)   Collection Time: 08/18/20 11:28 AM   Specimen: PATH Cytology CSF; Cerebrospinal Fluid  Result Value Ref Range Status   Specimen Description   Final    CSF Performed at [place] [address] [place],  [address]    Special Requests   Final    NONE Performed at [facility], [place] [address], [place],  [address]    Culture   Final    NO FUNGUS ISOLATED AFTER 2 DAYS Performed at [facility], [place] [address] [place],  [address]    Report Status PENDING  Incomplete     Time coordinating discharge: 35 minutes  SIGNED:   Barb Merino, MD  [facility] 08/20/2020, 1:01 PM

## 2020-08-20 NOTE — Progress Notes (Signed)
   08/20/20 0649  Vitals  BP (!) 162/70  BP Location Left Arm  BP Method Manual  ECG Heart Rate 70  Resp 15  MEWS COLOR  MEWS Score Color Green  MEWS Score  MEWS Temp 0  MEWS Systolic 0  MEWS Pulse 0  MEWS RR 0  MEWS LOC 0  MEWS Score 0   Reassessed pt after PRN labetalol, which was effective. BP now 162/70 HR 70. Will recheck again in 30 minutes.

## 2020-08-21 LAB — CSF CULTURE W GRAM STAIN
Culture: NO GROWTH
Gram Stain: NONE SEEN

## 2020-08-21 NOTE — Consult Note (Signed)
Consultation Note Date: 08/21/2020   Patient Name: April Decker  DOB: 12-24-1940  MRN: [id number]  Age / Sex: 79 y.o., female  PCP: Tisovec, Fransico Him, MD Referring Physician: No att. providers found  Reason for Consultation: Establishing goals of care  HPI/Patient Profile: 79 y.o. female  with past medical history of chronic A. fib, hypertension and advanced dementia who was brought to the ED with increased weakness and lethargy as well as poor responsiveness.  Prior to admission she was able to ambulate and have simple conversations.  She had recent worsening of symptoms and concern for infectious etiology.  Due to continued fevers and altered mental status, lumbar puncture was done.  Palliative care was consulted at that point in time for further discussion.   In the interim, her mental status seems to have improved greatly today.  Clinical Assessment and Goals of Care: Palliative care consult received.  Chart reviewed including personal review of pertinent labs and imaging.  I met today with patient's husband, Dr. Pollyann Savoy.  I introduced palliative care as specialized medical care for people living with serious illness. It focuses on providing relief from the symptoms and stress of a serious illness. The goal is to improve quality of life for both the patient and the family.  We discussed clinical course as well as wishes moving forward in care plan to have the best outlined knees by utilization of further advanced directives.   We discussed difference between a aggressive medical intervention path and a palliative, comfort focused care path.  Values and goals of care important to patient and family were attempted to be elicited.  In talking with her husband, I agree that she appears to be much improved from the prior couple of days based on my chart review.  At this point in time he is invested in plan to  transition to [facility] for trial of rehab to see much functional status she can regain.  We did discuss that she has progressive chronic illness and will continue to suffer from continued decrease in her nutrition, cognition, and functional status as her dementia progresses.  He expressed understanding and is competent in her care team as an outpatient and I can through this process.  Concept of Hospice and Palliative Care were discussed.  Questions and concerns addressed.   PMT will continue to support holistically.  SUMMARY OF RECOMMENDATIONS   -DNR/DNI -Her mental status is significantly improved today.  Plan for transition to [facility] for rehab. -We discussed outpatient palliative care with recommendation to consider having them follow an outpatient. -We reviewed a MOST form with recommendation for consideration of completing it to better delineate long-term goals of care.  Her husband was appreciative of the information but would like to discuss further with her outpatient providers prior to completion of MOST form.  Code Status/Advance Care Planning:  DNR  Psycho-social/Spiritual:   Desire for further Chaplaincy support:no  Additional Recommendations: Caregiving  Support/Resources  Prognosis:   Unable to determine  Discharge Planning: Skilled  Nursing Facility for rehab with Palliative care service follow-up      Primary Diagnoses: Present on Admission: . AMS (altered mental status) . Hypertension . GERD (gastroesophageal reflux disease) . Atrial fibrillation ([place]) . Alzheimer's dementia without behavioral disturbance ([place]) . AKI (acute kidney injury) ([place]) . Sepsis ([place]) . Dehydration   I have reviewed the medical record, interviewed the patient and family, and examined the patient. The following aspects are pertinent.  Past Medical History:  Diagnosis Date  . A-fib ([place])   . Anxiety   . 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 Atrophic vaginitis   . Cataract     . Chronic constipation 2009  . Coccygeal fracture ([place]) 2016   close fracture, no surgery  . Depression   . GERD (gastroesophageal reflux disease)   . Hemorrhoids   . Hypertension   . Osteoporosis   . Shingles 12/15   mild case   Social History   Socioeconomic History  . Marital status: Married    Spouse name: Not on file  . Number of children: Not on file  . Years of education: Not on file  . Highest education level: Not on file  Occupational History  . Not on file  Tobacco Use  . Smoking status: Never Smoker  . Smokeless tobacco: Never Used  Substance and Sexual Activity  . Alcohol use: No    Alcohol/week: 0.0 standard drinks    Comment: occ glass of wine  . Drug use: No  . Sexual activity: Never    Partners: Male    Birth control/protection: Post-menopausal, Abstinence  Other Topics Concern  . Not on file  Social History Narrative  . Not on file   Social Determinants of Health   Financial Resource Strain:   . Difficulty of Paying Living Expenses: Not on file  Food Insecurity:   . Worried About Charity fundraiser in the Last Year: Not on file  . Ran Out of Food in the Last Year: Not on file  Transportation Needs:   . Lack of Transportation (Medical): Not on file  . Lack of Transportation (Non-Medical): Not on file  Physical Activity:   . Days of Exercise per Week: Not on file  . Minutes of Exercise per Session: Not on file  Stress:   . Feeling of Stress : Not on file  Social Connections:   . Frequency of Communication with Friends and Family: Not on file  . Frequency of Social Gatherings with Friends and Family: Not on file  . Attends Religious Services: Not on file  . Active Member of Clubs or Organizations: Not on file  . Attends Archivist Meetings: Not on file  . Marital Status: Not on file   Family History  Problem Relation Age of Onset  . Liver cancer Mother   . Colon cancer Neg Hx   . Esophageal cancer Neg Hx   . Stomach cancer  Neg Hx   . Rectal cancer Neg Hx   . Colon polyps Neg Hx    Scheduled Meds: Continuous Infusions: PRN Meds:. Medications Prior to Admission:  Prior to Admission medications   Medication Sig Start Date End Date Taking? Authorizing Provider  aspirin EC 81 MG tablet Take 81 mg by mouth at bedtime.    Yes [provider]  BIOTIN PO Take 500 mcg by mouth at bedtime.    Yes [provider]  buPROPion (WELLBUTRIN XL) 300 MG 24 hr tablet Take 1 tablet (300 mg  total) by mouth daily. Patient taking differently: Take 300 mg by mouth daily after lunch.  04/05/20  Yes Mozingo, Berdie Ogren, NP  CALCIUM-MAGNESIUM-VITAMIN D PO Take 1 tablet by mouth at bedtime. Calcium 500 mg, magnesium 250 mg, D3 1000 units   Yes [provider]  Cholecalciferol (VITAMIN D) 125 MCG (5000 UT) CAPS Take 5,000 Units by mouth at bedtime.    Yes [provider]  cyanocobalamin (,VITAMIN B-12,) 1000 MCG/ML injection Inject 1,000 mcg into the muscle every 28 (twenty-eight) days. 07/26/20  Yes [provider]  donepezil (ARICEPT) 5 MG tablet Take 5 mg by mouth at bedtime. 03/03/20  Yes [provider]  escitalopram (LEXAPRO) 10 MG tablet Take 1 tablet (10 mg total) by mouth daily. Patient taking differently: Take 10 mg by mouth at bedtime.  06/10/20  Yes Mozingo, Berdie Ogren, NP  EVENING PRIMROSE OIL PO Take 1,300 mg by mouth at bedtime.    Yes [provider]  GLUCOSAMINE-CHONDROITIN PO Take 1 tablet by mouth at bedtime. Glucosamine 1500 mg/ chondroitin 1200 mg   Yes [provider]  Omega 3 1000 MG CAPS Take 1,000 mg by mouth at bedtime.    Yes [provider]  omeprazole (PRILOSEC) 20 MG capsule Take 20 mg by mouth at bedtime.    Yes [provider]  senna-docusate (SENNA-S) 8.6-50 MG tablet Take 1 tablet by mouth daily after lunch.   Yes [provider]  vitamin C (ASCORBIC ACID) 500 MG tablet Take 500 mg by mouth at bedtime.     Yes [provider]  haloperidol (HALDOL) 5 MG tablet Take 1 tablet (5 mg total) by mouth every 6 (six) hours as needed for agitation. 08/20/20 09/19/20  Barb Merino, MD  QUEtiapine (SEROQUEL) 25 MG tablet Take 1 tablet (25 mg total) by mouth at bedtime. 08/20/20 09/19/20  Barb Merino, MD   Allergies  Allergen Reactions  . Diltiazem Swelling    edema  . Iodides Anaphylaxis  . Other Anaphylaxis and Other (See Comments)    X-Ray Dye--brain stem  Other reaction(s): ANAPHYLAXIS    . Shellfish Allergy Hives  . Iohexol Hives         Review of Systems Denies complaints but unreliable historian due to confusion  Physical Exam  General: Alert, awake, in no acute distress.  HEENT: No bruits, no goiter, no JVD Heart: Regular rate and rhythm. No murmur appreciated. Lungs: Good air movement, clear Abdomen: Soft, nontender, nondistended, positive bowel sounds.  Ext: No significant edema Skin: Warm and dry Neuro: Grossly intact, nonfocal.   Vital Signs: BP (!) 178/88   Pulse 82   Temp 98 F (36.7 C)   Resp 15   Ht _0  (1.676 m)   Wt 48.6 kg   LMP 09/19/1995   SpO2 100%   BMI 17.29 kg/m  Pain Scale: 0-10   Pain Score: 0-No pain   SpO2: SpO2: 100 % O2 Device:SpO2: 100 % O2 Flow Rate: .O2 Flow Rate (L/min): 0 L/min  IO: Intake/output summary: No intake or output data in the 24 hours ending 08/21/20 1900  LBM: Last BM Date: 08/19/20 Baseline Weight: Weight: 47.6 kg Most recent weight: Weight: 48.6 kg     Palliative Assessment/Data:   Flowsheet Rows     Most Recent Value  Intake Tab  Referral Department Hospitalist  Unit at Time of Referral Med/Surg Unit  Palliative Care Primary Diagnosis Sepsis/Infectious Disease  Date Notified 08/17/20  Palliative Care Type New Palliative care  Date  of Admission 08/15/20  Date first seen by Palliative Care 08/19/20  # of days Palliative referral response time 2 Day(s)  # of days IP prior to Palliative referral 2   Clinical Assessment  Palliative Performance Scale Score 20%  Psychosocial & Spiritual Assessment  Palliative Care Outcomes  Patient/Family meeting held? Yes  Who was at the meeting? Husband      Time In: 59 Time Out: 1125 Time Total: 106 Greater than 50%  of this time was spent counseling and coordinating care related to the above assessment and plan.  Signed by: Micheline Rough, MD   Please contact Palliative Medicine Team phone at [phone] for questions and concerns.  For individual provider: See Shea Evans

## 2020-08-22 LAB — CULTURE, BLOOD (ROUTINE X 2)
Culture: NO GROWTH
Culture: NO GROWTH
Special Requests: ADEQUATE
Special Requests: ADEQUATE

## 2020-08-22 LAB — HSV(HERPES SIMPLEX VRS) I + II AB-IGM: HSVI/II Comb IgM: <0.91 Ratio (ref 0.00–0.90)

## 2020-08-23 LAB — ANAEROBIC CULTURE

## 2020-08-23 LAB — HSV 1/2 AB IGG/IGM CSF
HSV 1/2 Ab Screen IgG, CSF: <0.34 IV (ref <=0.89)
HSV 1/2 Ab, IgM, CSF: 0.24 IV (ref <=0.89)

## 2020-08-24 DIAGNOSIS — G309 Alzheimer's disease, unspecified: Secondary | ICD-10-CM | POA: Diagnosis not present

## 2020-08-24 DIAGNOSIS — F339 Major depressive disorder, recurrent, unspecified: Secondary | ICD-10-CM | POA: Diagnosis not present

## 2020-08-24 DIAGNOSIS — R531 Weakness: Secondary | ICD-10-CM | POA: Diagnosis not present

## 2020-08-24 DIAGNOSIS — I482 Chronic atrial fibrillation, unspecified: Secondary | ICD-10-CM | POA: Diagnosis not present

## 2020-08-24 DIAGNOSIS — F0281 Dementia in other diseases classified elsewhere with behavioral disturbance: Secondary | ICD-10-CM | POA: Diagnosis not present

## 2020-08-26 DIAGNOSIS — M25551 Pain in right hip: Secondary | ICD-10-CM | POA: Diagnosis not present

## 2020-08-26 DIAGNOSIS — W19XXXA Unspecified fall, initial encounter: Secondary | ICD-10-CM | POA: Diagnosis not present

## 2020-08-27 DIAGNOSIS — I10 Essential (primary) hypertension: Secondary | ICD-10-CM | POA: Diagnosis not present

## 2020-08-27 DIAGNOSIS — D519 Vitamin B12 deficiency anemia, unspecified: Secondary | ICD-10-CM | POA: Diagnosis not present

## 2020-08-27 DIAGNOSIS — F32A Depression, unspecified: Secondary | ICD-10-CM | POA: Diagnosis not present

## 2020-08-27 DIAGNOSIS — F0281 Dementia in other diseases classified elsewhere with behavioral disturbance: Secondary | ICD-10-CM | POA: Diagnosis not present

## 2020-08-27 DIAGNOSIS — M810 Age-related osteoporosis without current pathological fracture: Secondary | ICD-10-CM | POA: Diagnosis not present

## 2020-08-27 DIAGNOSIS — I482 Chronic atrial fibrillation, unspecified: Secondary | ICD-10-CM | POA: Diagnosis not present

## 2020-08-27 DIAGNOSIS — E43 Unspecified severe protein-calorie malnutrition: Secondary | ICD-10-CM | POA: Diagnosis not present

## 2020-08-31 DIAGNOSIS — F332 Major depressive disorder, recurrent severe without psychotic features: Secondary | ICD-10-CM | POA: Diagnosis not present

## 2020-09-08 LAB — CULTURE, FUNGUS WITHOUT SMEAR

## 2020-09-09 DIAGNOSIS — F419 Anxiety disorder, unspecified: Secondary | ICD-10-CM | POA: Diagnosis not present

## 2020-09-09 DIAGNOSIS — Z515 Encounter for palliative care: Secondary | ICD-10-CM | POA: Diagnosis not present

## 2020-09-09 DIAGNOSIS — I10 Essential (primary) hypertension: Secondary | ICD-10-CM | POA: Diagnosis not present

## 2020-09-09 DIAGNOSIS — Z9181 History of falling: Secondary | ICD-10-CM | POA: Diagnosis not present

## 2020-09-09 DIAGNOSIS — F0390 Unspecified dementia without behavioral disturbance: Secondary | ICD-10-CM | POA: Diagnosis not present

## 2020-09-09 DIAGNOSIS — R5381 Other malaise: Secondary | ICD-10-CM | POA: Diagnosis not present

## 2020-09-09 DIAGNOSIS — F32A Depression, unspecified: Secondary | ICD-10-CM | POA: Diagnosis not present

## 2020-09-09 DIAGNOSIS — I4891 Unspecified atrial fibrillation: Secondary | ICD-10-CM | POA: Diagnosis not present

## 2020-09-14 DIAGNOSIS — R238 Other skin changes: Secondary | ICD-10-CM | POA: Diagnosis not present

## 2020-09-14 DIAGNOSIS — Z7189 Other specified counseling: Secondary | ICD-10-CM | POA: Diagnosis not present

## 2020-09-16 DIAGNOSIS — R2689 Other abnormalities of gait and mobility: Secondary | ICD-10-CM | POA: Diagnosis not present

## 2020-09-16 DIAGNOSIS — R2681 Unsteadiness on feet: Secondary | ICD-10-CM | POA: Diagnosis not present

## 2020-09-16 DIAGNOSIS — R278 Other lack of coordination: Secondary | ICD-10-CM | POA: Diagnosis not present

## 2020-09-16 DIAGNOSIS — G309 Alzheimer's disease, unspecified: Secondary | ICD-10-CM | POA: Diagnosis not present

## 2020-09-17 DIAGNOSIS — R278 Other lack of coordination: Secondary | ICD-10-CM | POA: Diagnosis not present

## 2020-09-17 DIAGNOSIS — G309 Alzheimer's disease, unspecified: Secondary | ICD-10-CM | POA: Diagnosis not present

## 2020-09-17 DIAGNOSIS — R2689 Other abnormalities of gait and mobility: Secondary | ICD-10-CM | POA: Diagnosis not present

## 2020-09-17 DIAGNOSIS — R2681 Unsteadiness on feet: Secondary | ICD-10-CM | POA: Diagnosis not present

## 2020-12-17 DEATH — deceased

## 2022-04-18 IMAGING — CT CT ABD-PELV W/O CM
2 of 4 series · 16 of 46 positions shown, 18 images · non-contrast
Comparison: January 20, 2019

CLINICAL DATA: Abdominal pain.  Diverticulitis suspected.

EXAM:
CT ABDOMEN AND PELVIS WITHOUT CONTRAST
TECHNIQUE: Multidetector CT imaging of the abdomen and pelvis was performed
following the standard protocol without IV contrast.

[Series 2: axial st · axial · 0.70mm/px · z∈[-478,-83]mm · 13 of 87 slices shown, 15 images]
[im 4/87  soft-tissue]
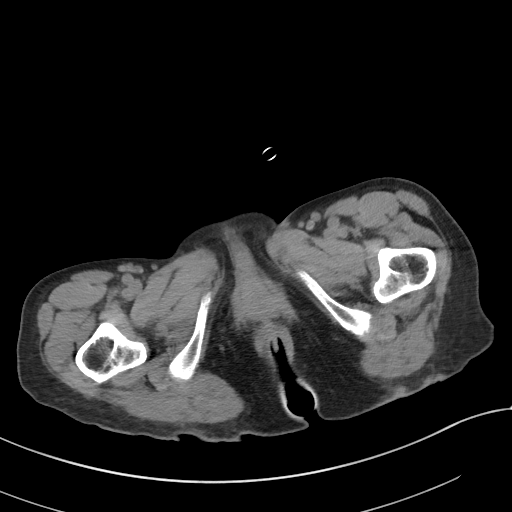
[im 4/87  bone]
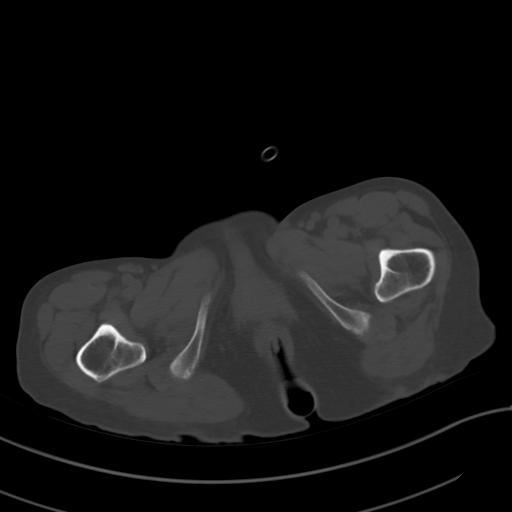
[im 11/87  soft-tissue]
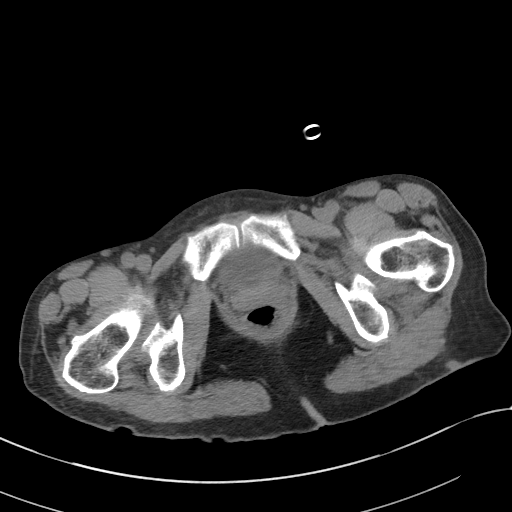
[im 18/87  soft-tissue]
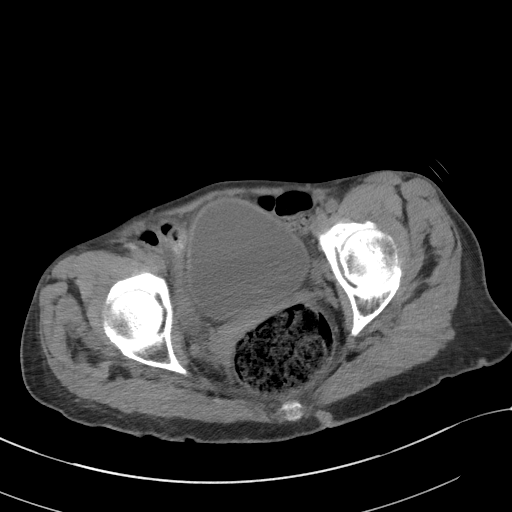
[im 25/87  soft-tissue]
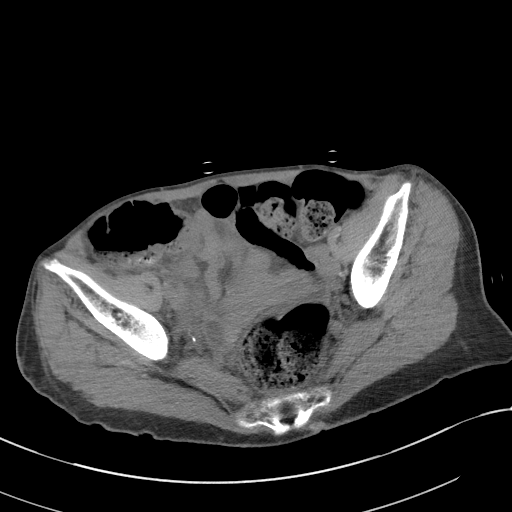
[im 31/87  soft-tissue]
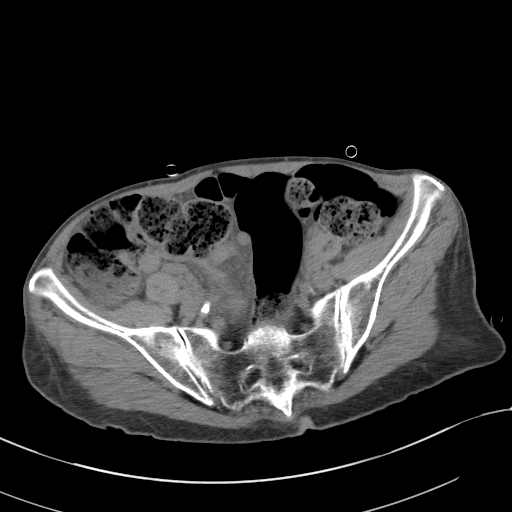
[im 38/87  soft-tissue]
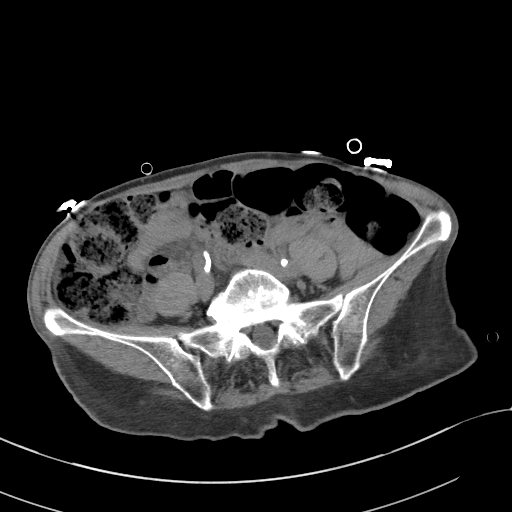
[im 45/87  soft-tissue]
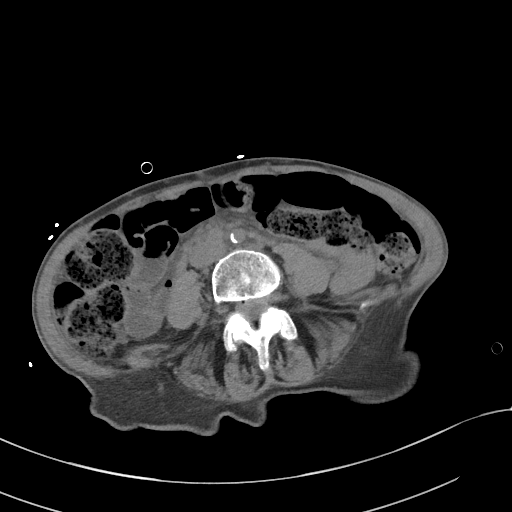
[im 49/87  soft-tissue]
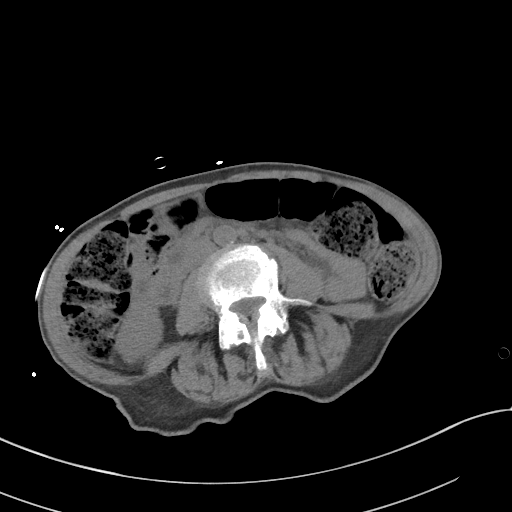
[im 56/87  soft-tissue]
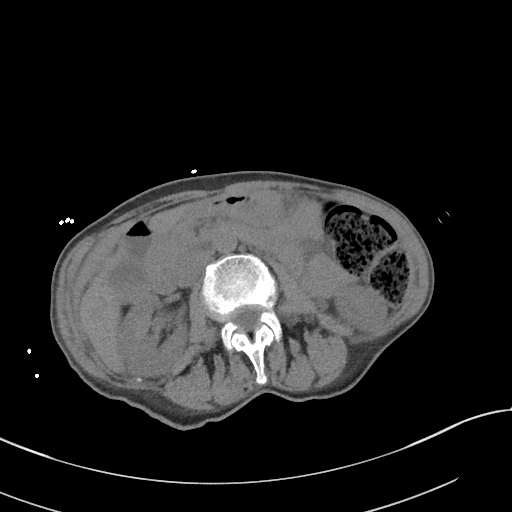
[im 56/87  bone]
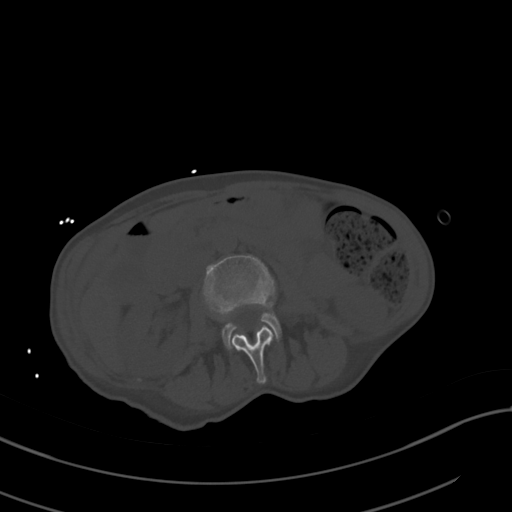
[im 62/87  soft-tissue]
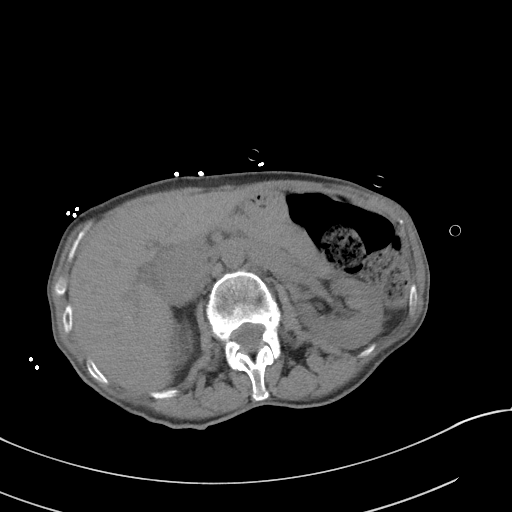
[im 69/87  soft-tissue]
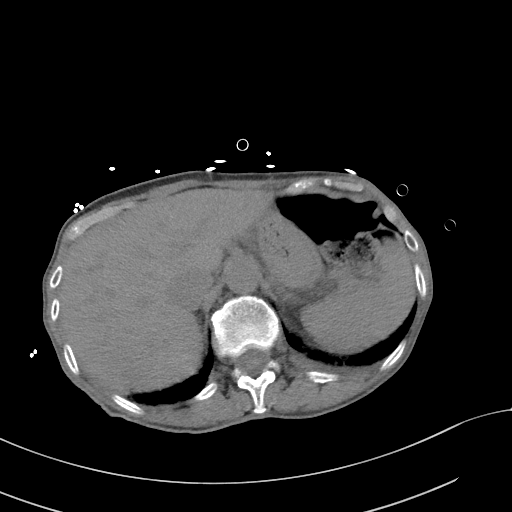
[im 76/87  soft-tissue]
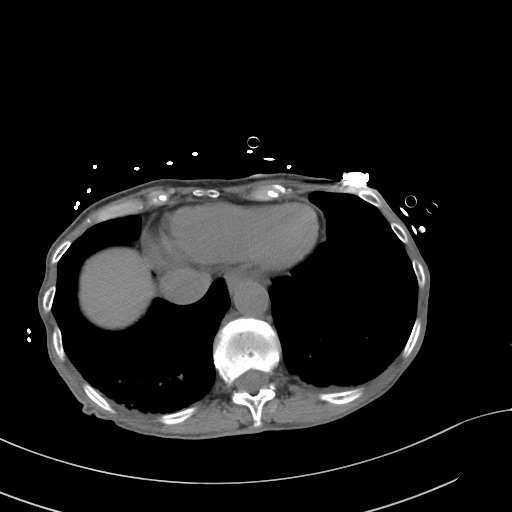
[im 83/87  soft-tissue]
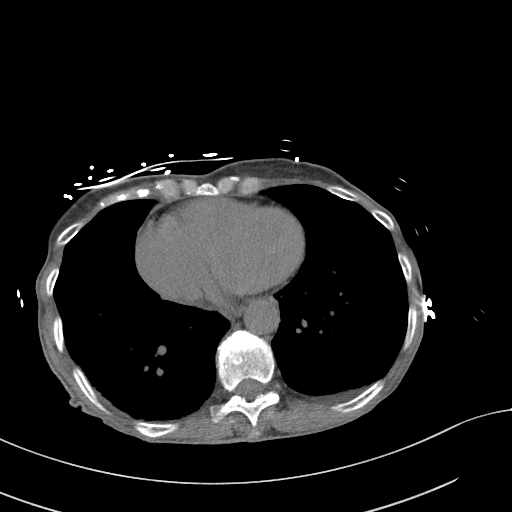

[Series 5: coronal st · coronal · 0.70mm/px · 3 of 66 slices shown]
[im 22/66  soft-tissue]
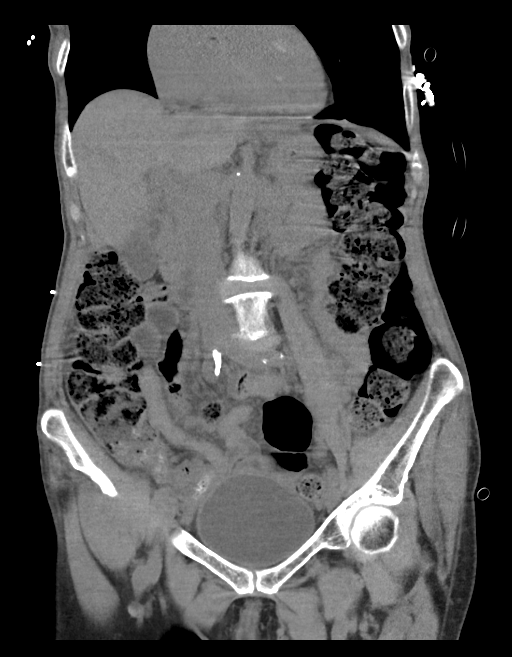
[im 29/66  soft-tissue]
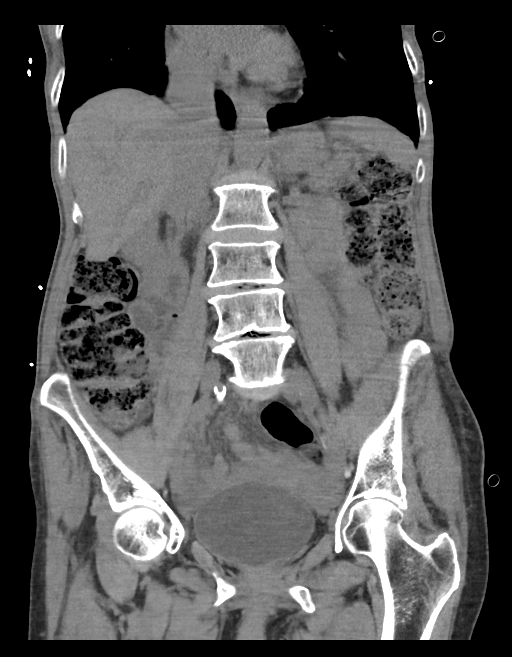
[im 37/66  soft-tissue]
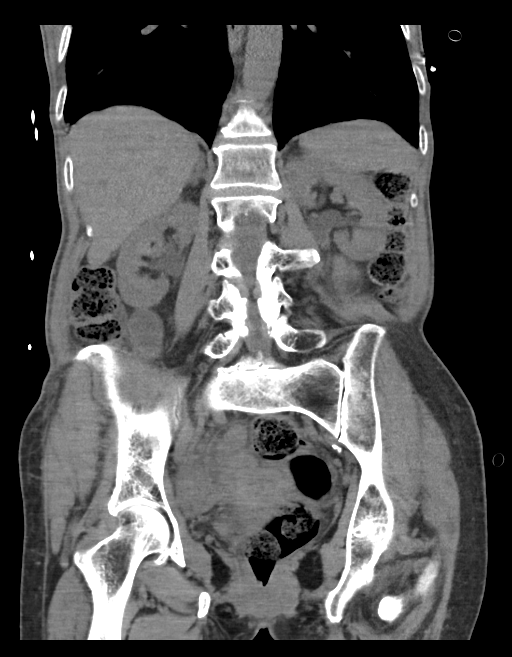

[16 of 46 positions shown; findings below may reference images not displayed]

FINDINGS: Lower chest: Minimal bilateral left greater than right pleural
thickening versus effusion.

Hepatobiliary: No focal liver abnormality is seen. No gallstones,
gallbladder wall thickening, or biliary dilatation.

Pancreas: Unremarkable. No pancreatic ductal dilatation or
surrounding inflammatory changes.

Spleen: Normal in size without focal abnormality.

Adrenals/Urinary Tract: Adrenal glands are unremarkable. Kidneys are
normal, without renal calculi, focal lesion, or hydronephrosis.
Bladder is unremarkable.

Stomach/Bowel: Stomach is within normal limits. No evidence of
appendicitis. No evidence of bowel wall thickening, distention, or
inflammatory changes. No evidence of diverticulosis. Moderate amount
of formed stool throughout the colon.

Vascular/Lymphatic: Aortic atherosclerosis. No enlarged abdominal or
pelvic lymph nodes.

Reproductive: Atrophic or surgically absent uterus. No adnexal
masses.

Other: No abdominal wall hernia or abnormality. No abdominopelvic
ascites.

Musculoskeletal: No acute osseous findings. Mild spondylosis of the
lumbosacral spine.
IMPRESSION: 1. No evidence of acute abnormalities within the abdomen or pelvis.
2. Moderate amount of formed stool throughout the colon, consistent
with constipation.
3. Minimal bilateral left greater than right pleural thickening
versus effusion.
4. Aortic atherosclerosis.

Aortic Atherosclerosis ([id number]).

## 2022-04-18 IMAGING — CT CT HEAD W/O CM
3 series · 15 of 47 positions shown, 18 images · non-contrast
Comparison: CT head dated 01/31/2014.

CLINICAL DATA: Altered mental status over the past 3 days.

EXAM:
CT HEAD WITHOUT CONTRAST
TECHNIQUE: Contiguous axial images were obtained from the base of the skull
through the vertex without intravenous contrast.

[Series 2: head wo · axial · 0.42mm/px · z∈[-181,-51]mm · 9 of 32 slices shown, 12 images]
[im 3/32  brain]
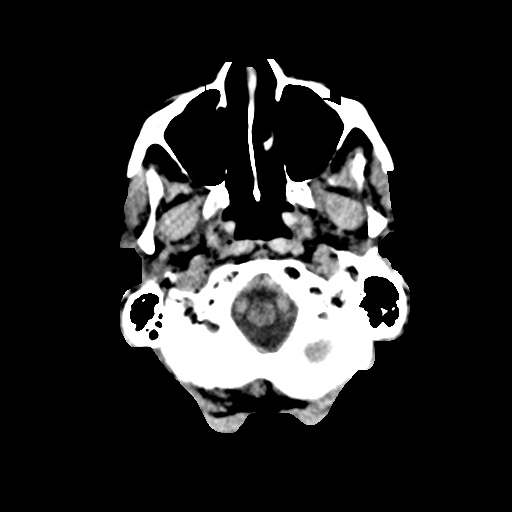
[im 3/32  bone]
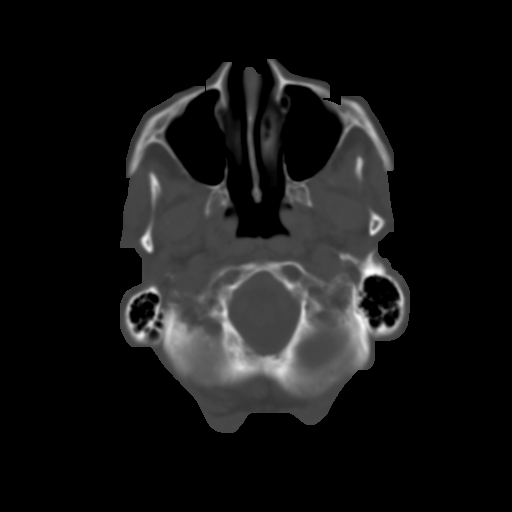
[im 6/32  brain]
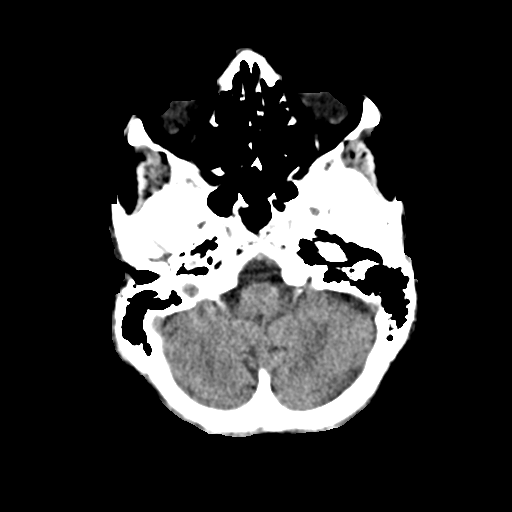
[im 9/32  brain]
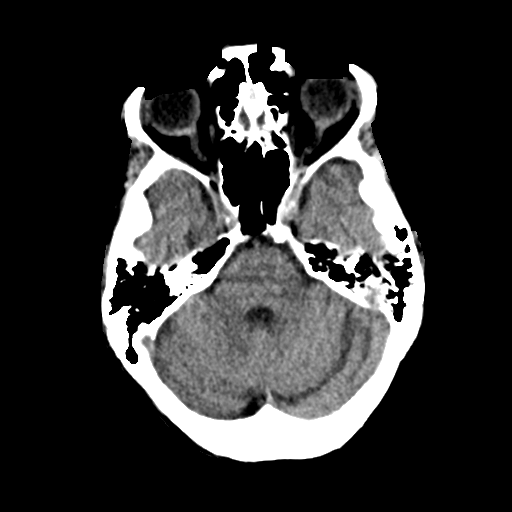
[im 12/32  brain]
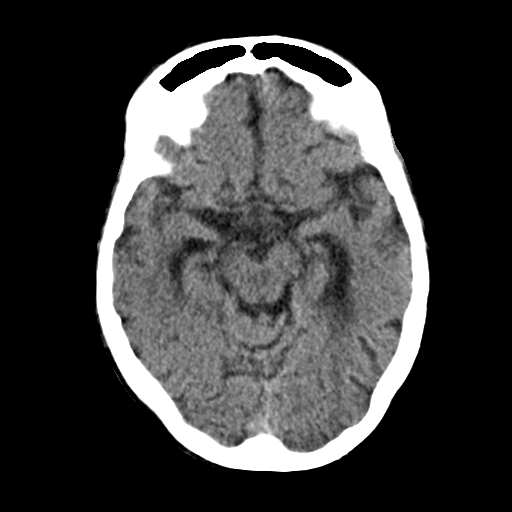
[im 17/32  brain]
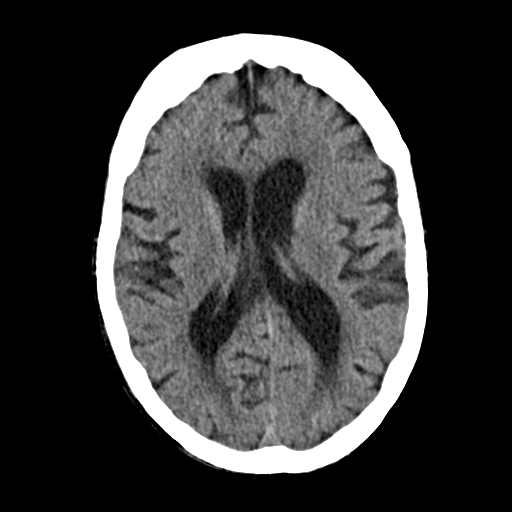
[im 17/32  bone]
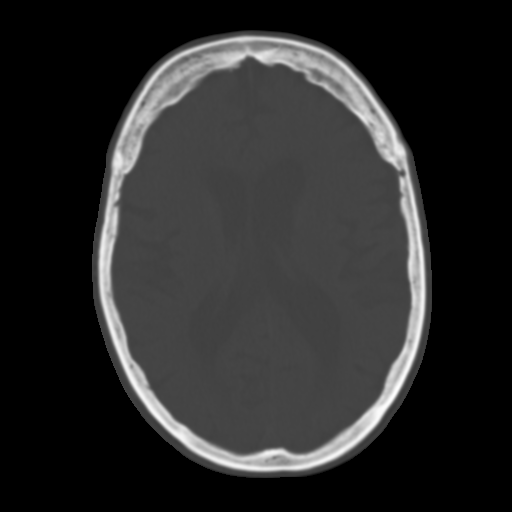
[im 20/32  brain]
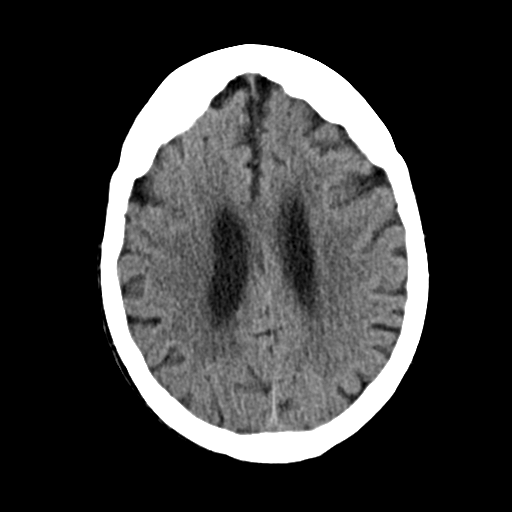
[im 23/32  brain]
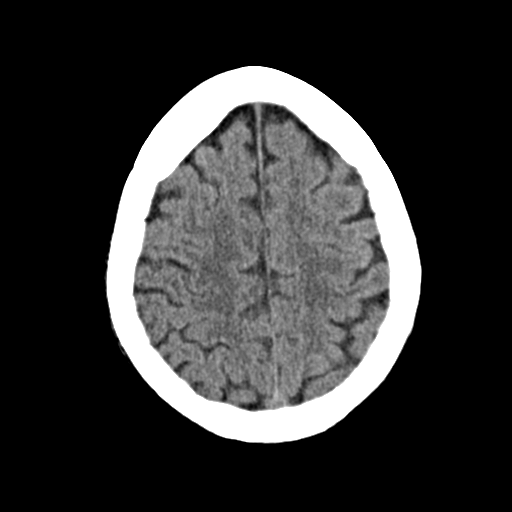
[im 26/32  brain]
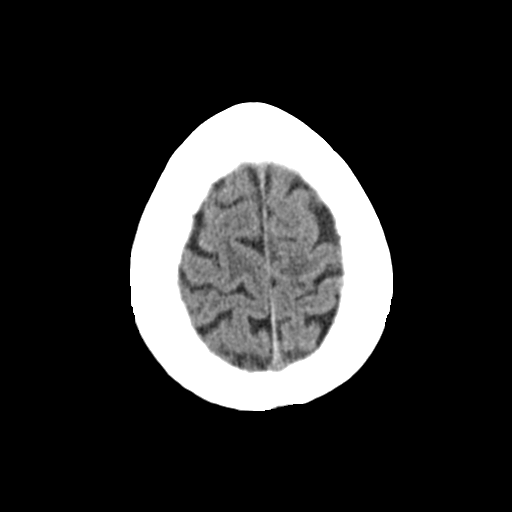
[im 29/32  brain]
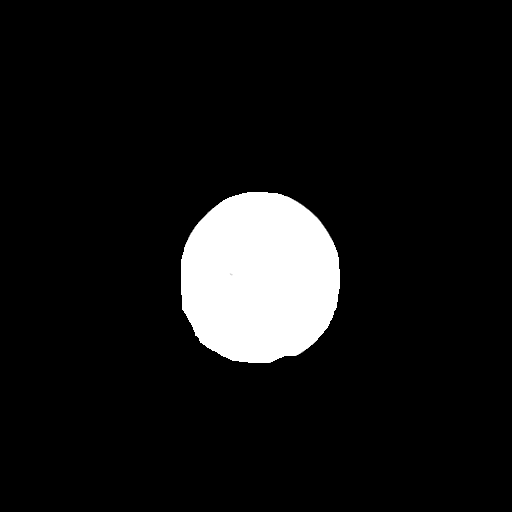
[im 29/32  bone]
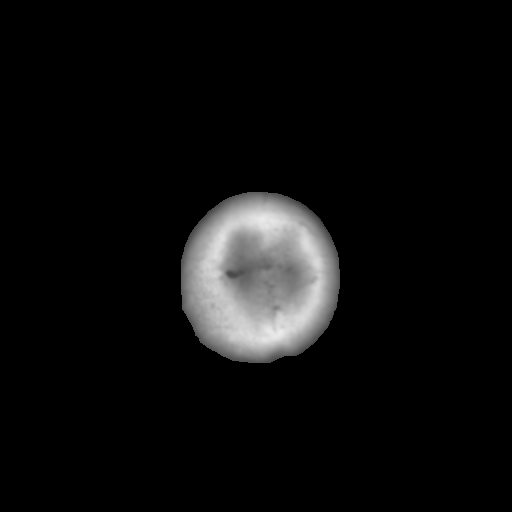

[Series 4: coronal soft · coronal · 0.31mm/px · 3 of 73 slices shown]
[im 25/73  brain]
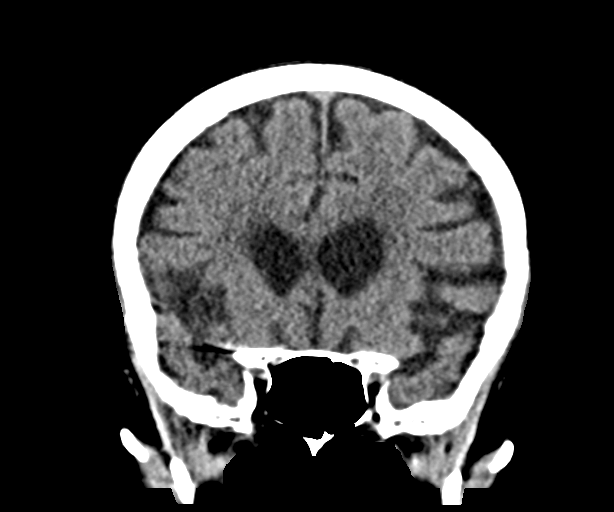
[im 33/73  brain]
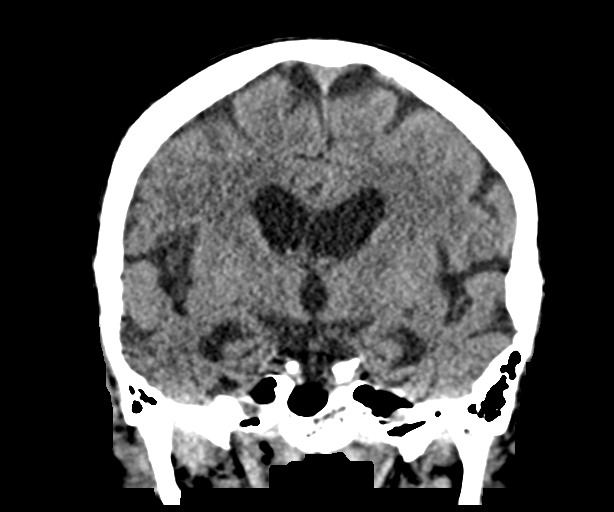
[im 41/73  brain]
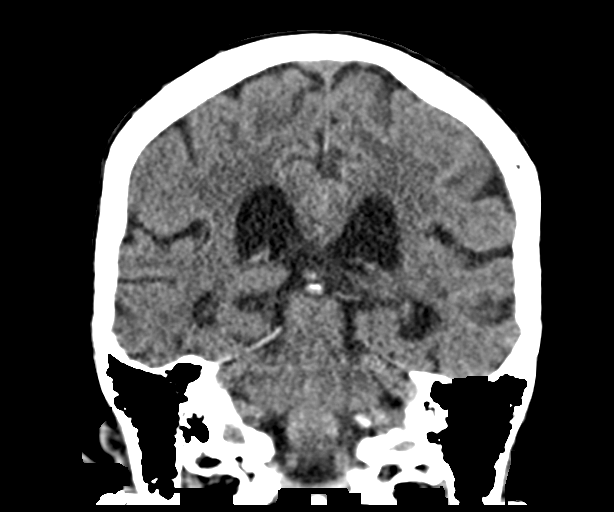

[Series 5: sag soft · sagittal · 0.31mm/px · 3 of 57 slices shown]
[im 19/57  brain]
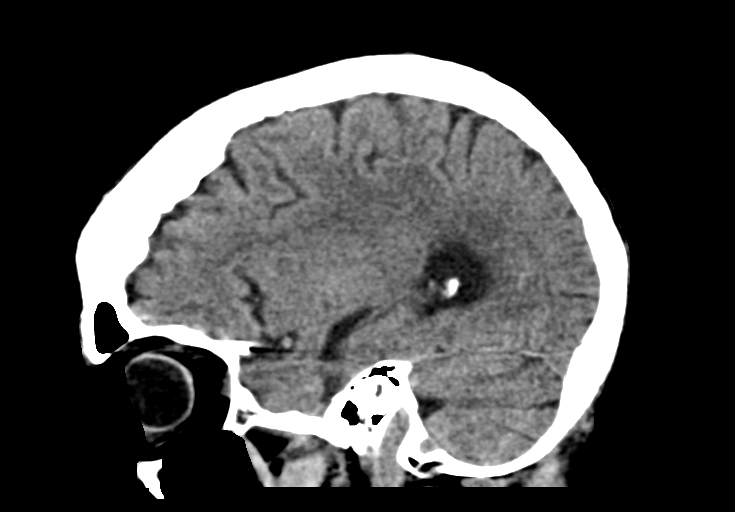
[im 29/57  brain]
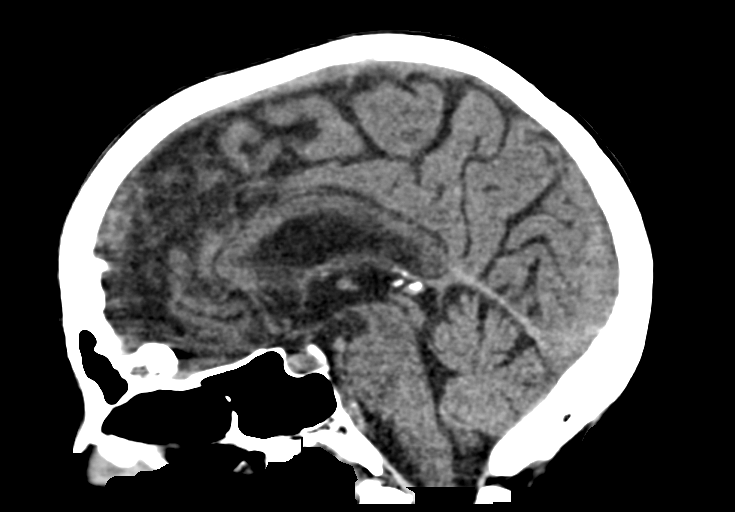
[im 38/57  brain]
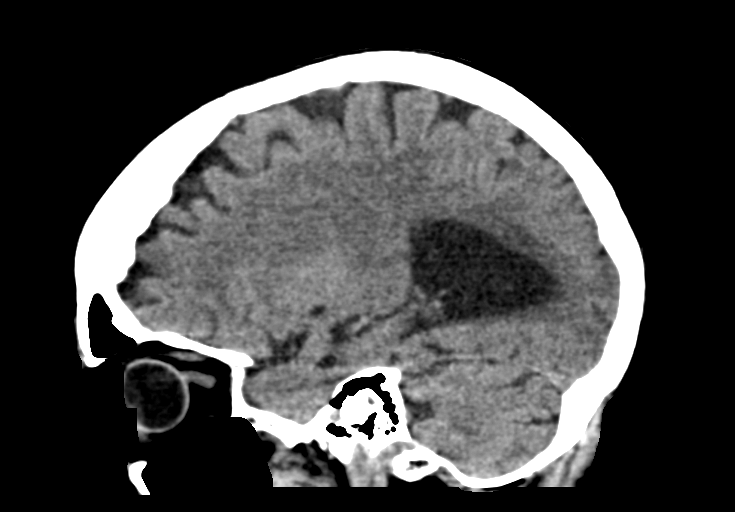

[15 of 47 positions shown; findings below may reference images not displayed]

FINDINGS: Brain: No evidence of acute infarction, hemorrhage, hydrocephalus,
extra-axial collection or mass lesion/mass effect. There is mild
cerebral volume loss with associated ex vacuo dilatation.
Periventricular white matter hypoattenuation likely represents
chronic small vessel ischemic disease.

Vascular: No hyperdense vessel or unexpected calcification.

Skull: Normal. Negative for fracture or focal lesion.

Sinuses/Orbits: No acute finding.

Other: None.
IMPRESSION: No acute intracranial process.

## 2022-04-20 IMAGING — DX DG CHEST 1V PORT
1 series · 1 of 1 positions shown · non-contrast
Comparison: 08/15/2020

CLINICAL DATA: 4 day history of weakness.

EXAM:
PORTABLE CHEST 1 VIEW

[chest ap]
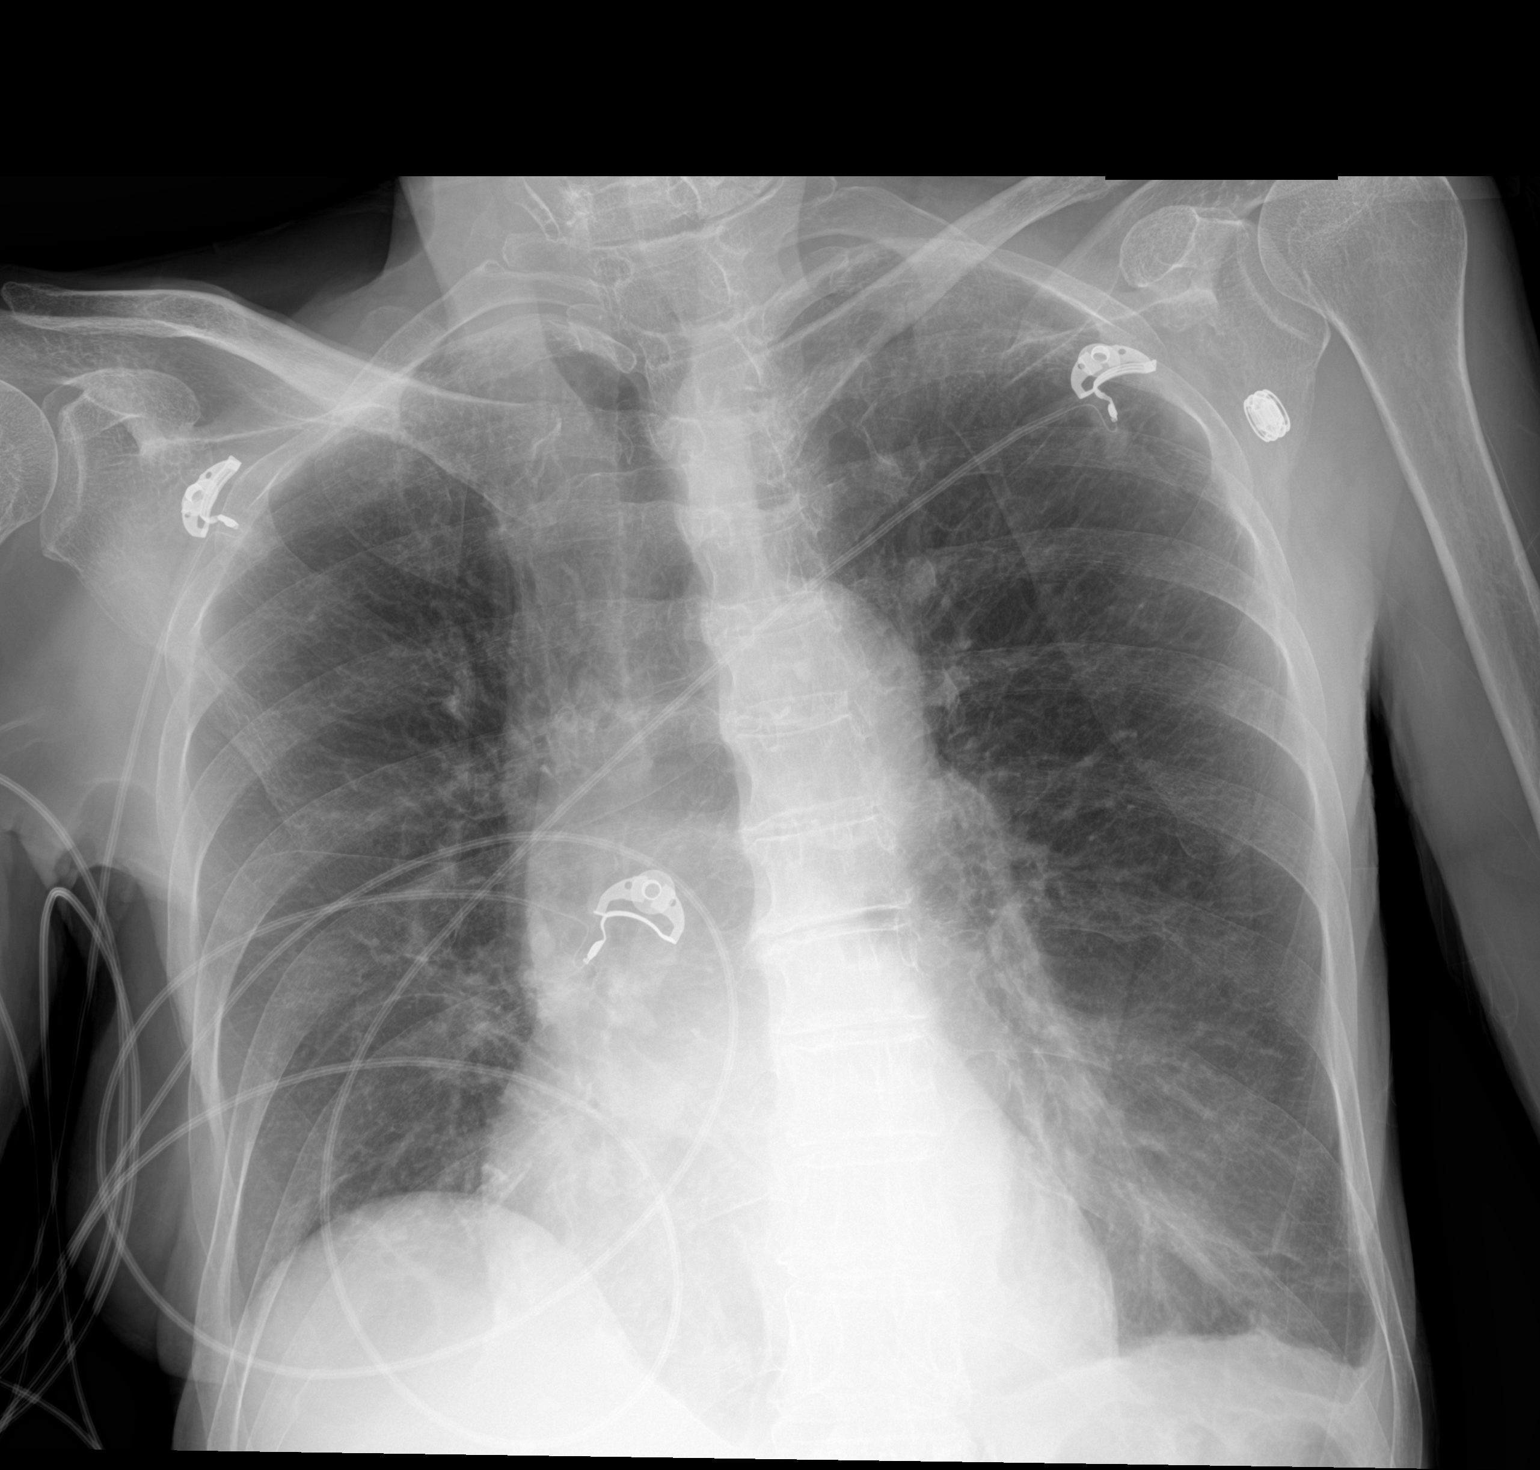

[1 of 1 positions shown; findings below may reference images not displayed]

FINDINGS: Lungs are hyperexpanded. Streaky opacity at the left base likely
atelectasis although superimposed component of pneumonia cannot be
excluded. The cardiopericardial silhouette is within normal limits
for size. Bones are diffusely demineralized. Telemetry leads overlie
the chest.
IMPRESSION: Hyperexpansion with atelectasis or infiltrate at the left base.
